# Patient Record
Sex: Male | Born: 1995 | Race: Black or African American | Hispanic: No | Marital: Single | Smoking: Never smoker
Health system: Southern US, Community
[De-identification: ages and names within clinical notes are randomized; demographics above are authoritative.]

## PROBLEM LIST (undated history)

## (undated) HISTORY — PX: KNEE SURGERY: SHX244

---

## 2020-05-28 ENCOUNTER — Emergency Department: Payer: Self-pay

## 2020-05-28 ENCOUNTER — Emergency Department
Admission: EM | Admit: 2020-05-28 | Discharge: 2020-05-28 | Disposition: A | Discharge status: Home / Self Care | Payer: Self-pay | Attending: Emergency Medicine | Admitting: Emergency Medicine

## 2020-05-28 ENCOUNTER — Other Ambulatory Visit: Payer: Self-pay

## 2020-05-28 DIAGNOSIS — R4020 Unspecified coma: Secondary | ICD-10-CM

## 2020-05-28 DIAGNOSIS — Z20822 Contact with and (suspected) exposure to covid-19: Secondary | ICD-10-CM | POA: Insufficient documentation

## 2020-05-28 DIAGNOSIS — R55 Syncope and collapse: Secondary | ICD-10-CM | POA: Insufficient documentation

## 2020-05-28 LAB — URINE DRUG SCREEN, QUALITATIVE (ARMC ONLY)
Amphetamines, Ur Screen: NOT DETECTED
Barbiturates, Ur Screen: NOT DETECTED
Benzodiazepine, Ur Scrn: NOT DETECTED
Cannabinoid 50 Ng, Ur ~~LOC~~: POSITIVE — AB
Cocaine Metabolite,Ur ~~LOC~~: NOT DETECTED
MDMA (Ecstasy)Ur Screen: NOT DETECTED
Methadone Scn, Ur: NOT DETECTED
Opiate, Ur Screen: NOT DETECTED
Phencyclidine (PCP) Ur S: NOT DETECTED
Tricyclic, Ur Screen: NOT DETECTED

## 2020-05-28 LAB — CBC WITH DIFFERENTIAL/PLATELET
Abs Immature Granulocytes: 0.01 10*3/uL (ref 0.00–0.07)
Basophils Absolute: 0 10*3/uL (ref 0.0–0.1)
Basophils Relative: 1 %
Eosinophils Absolute: 0.1 10*3/uL (ref 0.0–0.5)
Eosinophils Relative: 1 %
HCT: 39.4 % (ref 39.0–52.0)
Hemoglobin: 12.4 g/dL — ABNORMAL LOW (ref 13.0–17.0)
Immature Granulocytes: 0 %
Lymphocytes Relative: 35 %
Lymphs Abs: 1.9 10*3/uL (ref 0.7–4.0)
MCH: 24.4 pg — ABNORMAL LOW (ref 26.0–34.0)
MCHC: 31.5 g/dL (ref 30.0–36.0)
MCV: 77.4 fL — ABNORMAL LOW (ref 80.0–100.0)
Monocytes Absolute: 0.7 10*3/uL (ref 0.1–1.0)
Monocytes Relative: 13 %
Neutro Abs: 2.6 10*3/uL (ref 1.7–7.7)
Neutrophils Relative %: 50 %
Platelets: 231 10*3/uL (ref 150–400)
RBC: 5.09 MIL/uL (ref 4.22–5.81)
RDW: 14.3 % (ref 11.5–15.5)
WBC: 5.2 10*3/uL (ref 4.0–10.5)
nRBC: 0 % (ref 0.0–0.2)

## 2020-05-28 LAB — URINALYSIS, COMPLETE (UACMP) WITH MICROSCOPIC
Bacteria, UA: NONE SEEN
Bilirubin Urine: NEGATIVE
Glucose, UA: NEGATIVE mg/dL
Hgb urine dipstick: NEGATIVE
Ketones, ur: NEGATIVE mg/dL
Leukocytes,Ua: NEGATIVE
Nitrite: NEGATIVE
Protein, ur: NEGATIVE mg/dL
Specific Gravity, Urine: 1.023 (ref 1.005–1.030)
Squamous Epithelial / HPF: NONE SEEN (ref 0–5)
pH: 6 (ref 5.0–8.0)

## 2020-05-28 LAB — COMPREHENSIVE METABOLIC PANEL
ALT: 13 U/L (ref 0–44)
AST: 30 U/L (ref 15–41)
Albumin: 4.2 g/dL (ref 3.5–5.0)
Alkaline Phosphatase: 51 U/L (ref 38–126)
Anion gap: 6 (ref 5–15)
BUN: 9 mg/dL (ref 6–20)
CO2: 25 mmol/L (ref 22–32)
Calcium: 8.9 mg/dL (ref 8.9–10.3)
Chloride: 107 mmol/L (ref 98–111)
Creatinine, Ser: 1.07 mg/dL (ref 0.61–1.24)
GFR, Estimated: >60 mL/min (ref >60)
Glucose, Bld: 97 mg/dL (ref 70–99)
Potassium: 4 mmol/L (ref 3.5–5.1)
Sodium: 138 mmol/L (ref 135–145)
Total Bilirubin: 0.9 mg/dL (ref 0.3–1.2)
Total Protein: 7.7 g/dL (ref 6.5–8.1)

## 2020-05-28 LAB — ETHANOL: Alcohol, Ethyl (B): <10 mg/dL (ref <10)

## 2020-05-28 LAB — RESP PANEL BY RT-PCR (FLU A&B, COVID) ARPGX2
Influenza A by PCR: NEGATIVE
Influenza B by PCR: NEGATIVE
SARS Coronavirus 2 by RT PCR: NEGATIVE

## 2020-05-28 LAB — LACTIC ACID, PLASMA: Lactic Acid, Venous: 1.1 mmol/L (ref 0.5–1.9)

## 2020-05-28 MED ORDER — IBUPROFEN 600 MG PO TABS
600.0000 mg | ORAL_TABLET | Freq: Once | ORAL | Status: AC
  Administered 2020-05-28: 600 mg via ORAL
  Filled 2020-05-28: qty 1

## 2020-05-28 NOTE — Discharge Instructions (Addendum)
It is unclear at this point if you had a seizure or not. Therefore it is very important they follow-up with a primary care doctor and a neurologist for further evaluation.  In the meantime please follow the below recommendations.  Seizures may happen at any time. It is important to take certain precautions to maintain your safety.   Follow up with your doctor in 1-3 days.  If you were started on a seizure medication, take it as prescribed.  During a seizure, a person may injure himself or herself. Seizure precautions are guidelines that a person can follow in order to minimize injury during a seizure. For any activity, it is important to ask, "What would happen if I had a seizure while doing this?" Follow the below precautions.  Bathroom Safety  A person with seizures may want to shower instead of bathe to avoid accidental drowning. If falls occur during the patient's typical seizure, a person should use a shower seat, preferably one with a safety strap.  Use nonskid strips in your shower or tub.  Never use electrical equipment near water. This prevents accidental electrocution.  Consider changing glass in shower doors to shatterproof glass.  Secondary school teacher If possible, cook when someone else is nearby.  Use the back burners of the stove to prevent accidental burns.  Use shatterproof containers as much as possible. For instance, sauces can be transferred from glass bottles to plastic containers for use.  Limit time that is required using knives or other sharp objects. If possible, buy foods that are already cut, or ask someone to help in meal preparation.   General Safety at Home Do not smoke or light fires in the fireplace unless someone else is present.  Do not use space heaters that can be accidentally overturned.  When alone, avoid using step stools or ladders, and do not clean rooftop gutters.  Purchase power tools and motorized Risk manager which have a safety switch that will stop  the machine if you release the handle (a 'dead man's' switch).   Driving and Transportation DO NOT DRIVE UNTIL YOU ARE CLEARED BY A NEUROLOGIST and/or you have permission to drive from your state's Department of Motor Vehicles  [facility]). Each state has different laws. Please refer to the following link on the [employer]'s website for more information: http://www.epilepsyfoundation.org/answerplace/Social/driving/drivingu.cfm  If you ride a bicycle, wear a helmet and any other necessary protective gear.  When taking public transportation like the bus or subway, stay clear of the platform edge.   Outdoor Theatre manager is okay, but does present certain risks. Never swim alone, and tell friends what to do if you have a seizure while swimming.  Wear appropriate protective equipment.  Ski with a friend. If a seizure occurs, your friend can seek help, if needed. He or she can also help to get you out of the cold. Consider using a safety hook or belt while riding the ski lift.

## 2020-05-28 NOTE — ED Provider Notes (Signed)
[facility] Emergency Department Provider Note  ____________________________________________  Time seen: Approximately 4:38 AM  I have reviewed the triage vital signs and the nursing notes.   HISTORY  Chief Complaint Seizures   HPI Brian Summers is a 24 y.o. male no significant past medical history who presents for evaluation of loss of consciousness.  Patient is brought in by his boyfriend who provides most of the history.  According to him yesterday patient had an episode where he lost consciousness, became stiff and what he describes as generalized shaking.  That lasted 5 minutes. No post-ictal phase, no urinary or bowel loss. Patient asked boyfriend not to call 911. This evening, they were with some friends wrapping gifts when another episode happened. Boyfriend describes as patient loosing consciousness, becoming stiff, and then shaking all extremities.  He reports that the entire episode lasted 15 minutes.  He reports that he had a brief pause of shaking in the middle of it but started again.  Again no postictal phase, patient has no recollection of these events.  He has no warning symptoms.  He has no history of seizures.  His father has a history of epilepsy.  No history of febrile seizures as an infant.  No urinary or bowel loss, no tongue trauma.  Patient did not injury himself from these episodes.  Patient reports occasional edibles and alcohol but none yesterday or today.  Denies any other drug use.  He is complaining of generalized muscle aches. Patient reports 4 days of decreased appetite, has not been eating or drinking well, and had 1 or 2 episodes of NBNB emesis. No diarrhea, no fever, no cough.  No known exposures to Covid or flu.  No family history of sudden death  PMH None - reviewed  Allergies Patient has no known allergies.  FH Father - epilepsy  Social History Smoking - never Alcohol - occasionally Drugs - edible  Review of  Systems  Constitutional: Negative for fever. + LOC Eyes: Negative for visual changes. ENT: Negative for sore throat. Neck: No neck pain  Cardiovascular: Negative for chest pain. Respiratory: Negative for shortness of breath. Gastrointestinal: Negative for abdominal pain, diarrhea. + vomiting Genitourinary: Negative for dysuria. Musculoskeletal: Negative for back pain. Skin: Negative for rash. Neurological: Negative for headaches, weakness or numbness. Psych: No SI or HI  ____________________________________________   PHYSICAL EXAM:  VITAL SIGNS: ED Triage Vitals  Enc Vitals Group     BP 05/28/20 0234 135/85     Pulse Rate 05/28/20 0234 65     Resp 05/28/20 0234 15     Temp 05/28/20 0234 98.1 F (36.7 C)     Temp Source 05/28/20 0234 Oral     SpO2 05/28/20 0234 100 %     Weight 05/28/20 0230 132 lb (59.9 kg)     Height 05/28/20 0230 5\' 10"  (1.778 m)     Head Circumference --      Peak Flow --      Pain Score 05/28/20 0230 8     Pain Loc --      Pain Edu? --      Excl. in GC? --     Constitutional: Alert and oriented. Well appearing and in no apparent distress. HEENT:      Head: Normocephalic and atraumatic.         Eyes: Conjunctivae are normal. Sclera is non-icteric.       Mouth/Throat: Mucous membranes are moist.       Neck: Supple  with no signs of meningismus. No c spine tenderness Cardiovascular: Regular rate and rhythm. No murmurs, gallops, or rubs. 2+ symmetrical distal pulses are present in all extremities. No JVD. Respiratory: Normal respiratory effort. Lungs are clear to auscultation bilaterally. No wheezes, crackles, or rhonchi.  Gastrointestinal: Soft, non tender, and non distended with positive bowel sounds. No rebound or guarding. Musculoskeletal: Nontender with normal range of motion in all extremities.  No T and L-spine tenderness.  No edema, cyanosis, or erythema of extremities. Neurologic: Normal speech and language. Face is symmetric. EOMI, PERRl, no  nystagmus, Moving all extremities. No gross focal neurologic deficits are appreciated. Skin: Skin is warm, dry and intact. No rash noted. Psychiatric: Mood and affect are normal. Speech and behavior are normal.  ____________________________________________   LABS (all labs ordered are listed, but only abnormal results are displayed)  Labs Reviewed  CBC WITH DIFFERENTIAL/PLATELET - Abnormal; Notable for the following components:      Result Value   Hemoglobin 12.4 (*)    MCV 77.4 (*)    MCH 24.4 (*)    All other components within normal limits  RESP PANEL BY RT-PCR (FLU A&B, COVID) ARPGX2  COMPREHENSIVE METABOLIC PANEL  LACTIC ACID, PLASMA  ETHANOL  URINE DRUG SCREEN, QUALITATIVE ([facility] ONLY)  URINALYSIS, COMPLETE (UACMP) WITH MICROSCOPIC   ____________________________________________  EKG  ED ECG REPORT I, Nita Sickle, the attending physician, personally viewed and interpreted this ECG.  Normal sinus rhythm, normal intervals, normal axis, no STE or depressions, no evidence of HOCM, AV block, delta wave, ARVD, prolonged QTc, WPW, or Brugada.   ____________________________________________  RADIOLOGY  I have personally reviewed the images performed during this visit and I agree with the Radiologist's read.   Interpretation by Radiologist:  CT Head Wo Contrast  Result Date: 05/28/2020 CLINICAL DATA:  Seizure EXAM: CT HEAD WITHOUT CONTRAST TECHNIQUE: Contiguous axial images were obtained from the base of the skull through the vertex without intravenous contrast. COMPARISON:  None. FINDINGS: Brain: No evidence of acute territorial infarction, hemorrhage, hydrocephalus,extra-axial collection or mass lesion/mass effect. Normal gray-white differentiation. Ventricles are normal in size and contour. Vascular: No hyperdense vessel or unexpected calcification. Skull: The skull is intact. No fracture or focal lesion identified. Sinuses/Orbits: The visualized paranasal sinuses and  mastoid air cells are clear. The orbits and globes intact. Other: None IMPRESSION: No acute intracranial abnormality. Electronically Signed   By: Jonna Clark M.D.   On: 05/28/2020 03:58    ____________________________________________   PROCEDURES  Procedure(s) performed:yes .1-3 Lead EKG Interpretation Performed by: Nita Sickle, MD Authorized by: Nita Sickle, MD     Interpretation: non-specific     ECG rate assessment: normal     Rhythm: sinus rhythm     Ectopy: none     Critical Care performed:  None ____________________________________________   INITIAL IMPRESSION / ASSESSMENT AND PLAN / ED COURSE  24 y.o. male no significant past medical history who presents for evaluation of loss of consciousness.  Episodes described as loss of consciousness with stiffening and generalized shaking that lasted 15 minutes with no postictal phase, no urinary or bowel loss, no tongue trauma.  Patient is complete neurologically intact.  His father does have a history of epilepsy.  Seems to be the second episode in the last 2 days.  No prior episodes.  Patient is extremely well-appearing in no distress, neurologically intact.  No signs of trauma on exam.  EKG with no signs of dysrhythmias.  Patient placed on telemetry for monitor for any  signs of dysrhythmias.  Labs show no leukocytosis, no significant electrolyte derangements, negative alcohol level.  Head CT with no mass or abnormalities, visualized by me confirmed by radiology.  Lactic acidosis is 1.1 which makes it less likely that this was a seizure.  I would expect that a 15-minute seizure would cause some lactic acidosis on the patient from muscle ischemia.  Will monitor closely for any signs of dysrhythmias.  Patient will need further evaluation as an outpatient with Holter monitoring and EEG. Denies any drugs in the last 2 days. Has edible occasionally. Anorexia and vomiting for the last few days. Possibly viral. Will check for covid  and flu.  History gathered mostly from patient's boyfriend who witnessed both episodes but also from patient himself.  Plan discussed with both of them.  _________________________ 6:21 AM on 05/28/2020 -----------------------------------------  Patient continued to be monitored with further episodes of LOC.  Will refer to primary care and neurology for further evaluation.  Discussed with him the importance of Holter monitoring and EEG to rule out cardiac dysrhythmias and seizure.  In the meantime discussed pretty strict seizure precautions.  Discussed my standard return precautions for     _____________________________________________ Please note:  Patient was evaluated in Emergency Department today for the symptoms described in the history of present illness. Patient was evaluated in the context of the global COVID-19 pandemic, which necessitated consideration that the patient might be at risk for infection with the SARS-CoV-2 virus that causes COVID-19. Institutional protocols and algorithms that pertain to the evaluation of patients at risk for COVID-19 are in a state of rapid change based on information released by regulatory bodies including the CDC and federal and state organizations. These policies and algorithms were followed during the patient's care in the ED.  Some ED evaluations and interventions may be delayed as a result of limited staffing during the pandemic.   [place] Controlled Substance Database was reviewed by me. ____________________________________________   FINAL CLINICAL IMPRESSION(S) / ED DIAGNOSES   Final diagnoses:  Loss of consciousness (HCC)      NEW MEDICATIONS STARTED DURING THIS VISIT:  ED Discharge Orders    None       Note:  This document was prepared using Dragon voice recognition software and may include unintentional dictation errors.    Don Perking, [place], MD 05/28/20 [phone]

## 2020-05-28 NOTE — ED Triage Notes (Signed)
Pt BIBA from home for possible seizure. EMS reports seizure had ended by the time they arrived. EMS reports speaking with pts mother who stated pt had had seizures in past and had stopped taking meds. However, pt denies any hx of seizures and denies ever being on anti seizure meds. Pt reports hx of syncopal episodes. Pt A&O x 4 on arrival. Pt has c/o generalized mild pain at this time.

## 2020-08-18 ENCOUNTER — Other Ambulatory Visit: Payer: Self-pay

## 2020-08-26 ENCOUNTER — Other Ambulatory Visit: Payer: Self-pay

## 2020-10-03 ENCOUNTER — Other Ambulatory Visit: Payer: Self-pay

## 2020-10-03 ENCOUNTER — Inpatient Hospital Stay
Admission: EM | Admit: 2020-10-03 | Discharge: 2020-10-10 | DRG: 095 | Disposition: A | Discharge status: Home Health | Payer: Self-pay | Attending: Internal Medicine | Admitting: Internal Medicine

## 2020-10-03 DIAGNOSIS — Z820 Family history of epilepsy and other diseases of the nervous system: Secondary | ICD-10-CM

## 2020-10-03 DIAGNOSIS — R531 Weakness: Secondary | ICD-10-CM

## 2020-10-03 DIAGNOSIS — Z20822 Contact with and (suspected) exposure to covid-19: Secondary | ICD-10-CM | POA: Diagnosis present

## 2020-10-03 DIAGNOSIS — B259 Cytomegaloviral disease, unspecified: Secondary | ICD-10-CM | POA: Diagnosis present

## 2020-10-03 DIAGNOSIS — E739 Lactose intolerance, unspecified: Secondary | ICD-10-CM | POA: Diagnosis present

## 2020-10-03 DIAGNOSIS — Z8249 Family history of ischemic heart disease and other diseases of the circulatory system: Secondary | ICD-10-CM

## 2020-10-03 DIAGNOSIS — R7401 Elevation of levels of liver transaminase levels: Secondary | ICD-10-CM | POA: Diagnosis present

## 2020-10-03 DIAGNOSIS — R000 Tachycardia, unspecified: Secondary | ICD-10-CM | POA: Diagnosis not present

## 2020-10-03 DIAGNOSIS — R200 Anesthesia of skin: Secondary | ICD-10-CM

## 2020-10-03 DIAGNOSIS — G9389 Other specified disorders of brain: Secondary | ICD-10-CM | POA: Diagnosis present

## 2020-10-03 DIAGNOSIS — H5712 Ocular pain, left eye: Secondary | ICD-10-CM | POA: Diagnosis present

## 2020-10-03 DIAGNOSIS — E871 Hypo-osmolality and hyponatremia: Secondary | ICD-10-CM | POA: Diagnosis not present

## 2020-10-03 DIAGNOSIS — R2681 Unsteadiness on feet: Secondary | ICD-10-CM | POA: Diagnosis present

## 2020-10-03 DIAGNOSIS — R110 Nausea: Secondary | ICD-10-CM | POA: Diagnosis not present

## 2020-10-03 DIAGNOSIS — Z9181 History of falling: Secondary | ICD-10-CM

## 2020-10-03 DIAGNOSIS — G610 Guillain-Barre syndrome: Principal | ICD-10-CM | POA: Diagnosis present

## 2020-10-03 DIAGNOSIS — E538 Deficiency of other specified B group vitamins: Secondary | ICD-10-CM | POA: Diagnosis present

## 2020-10-03 DIAGNOSIS — R197 Diarrhea, unspecified: Secondary | ICD-10-CM | POA: Diagnosis not present

## 2020-10-03 DIAGNOSIS — D649 Anemia, unspecified: Secondary | ICD-10-CM | POA: Diagnosis present

## 2020-10-03 DIAGNOSIS — E559 Vitamin D deficiency, unspecified: Secondary | ICD-10-CM | POA: Diagnosis present

## 2020-10-03 NOTE — ED Provider Notes (Signed)
[facility] Emergency Department Provider Note  ____________________________________________   Event Date/Time   First MD Initiated Contact with Patient 10/03/20 2343     (approximate)  I have reviewed the triage vital signs and the nursing notes.   HISTORY  Chief Complaint Leg Pain    HPI Brian Summers is a 25 y.o. male with no significant past medical history who presents to the emergency department with multiple complaints.  He states that symptoms started about a week ago when he began having sharp, severe pain in his right lower extremity.  States he then started feeling numbness in the right thigh, shin and foot.  States then pain moved to the left leg and he feels that the left shin and left foot are now numb.  He states he is also now started having tingling in all of his fingertips on both sides.  He is having pain going down the left arm as well.  States he then started having numbness to the left face and has had 2 episodes where he was unable to open the left eye and was concerned that he may be having a stroke.  He is complaining of neck pain and lower back pain.  He denies any injury to his neck or back.  No headache.  No fevers, cough, vomiting, diarrhea.  No chest pain or shortness of breath.  He has never had similar symptoms.  No family history of multiple sclerosis.  He denies any weight loss, night sweats.  Denies any drug or alcohol use.        History reviewed. No pertinent past medical history.  Patient Active Problem List   Diagnosis Date Noted  . Weakness 10/04/2020    Past Surgical History:  Procedure Laterality Date  . KNEE SURGERY Bilateral     Prior to Admission medications   Not on File    Allergies Patient has no known allergies.  History reviewed. No pertinent family history.  Social History Social History   Tobacco Use  . Smoking status: Never Smoker  . Smokeless tobacco: Never Used  Substance Use Topics  .  Alcohol use: Never  . Drug use: Never    Review of Systems Constitutional: No fever. Eyes: No visual changes. ENT: No sore throat. Cardiovascular: Denies chest pain. Respiratory: Denies shortness of breath. Gastrointestinal: No nausea, vomiting, diarrhea. Genitourinary: Negative for dysuria. Musculoskeletal: + for back pain. Skin: Negative for rash. Neurological: + for focal weakness or numbness.  ____________________________________________   PHYSICAL EXAM:  VITAL SIGNS: ED Triage Vitals  Enc Vitals Group     BP 10/03/20 2340 (!) 152/92     Pulse Rate 10/03/20 2340 66     Resp 10/03/20 2340 15     Temp 10/03/20 2340 98 F (36.7 C)     Temp Source 10/03/20 2340 Oral     SpO2 10/03/20 2340 100 %     Weight 10/03/20 2341 130 lb (59 kg)     Height 10/03/20 2341  (1.778 m)     Head Circumference --      Peak Flow --      Pain Score 10/03/20 2340 10     Pain Loc --      Pain Edu? --      Excl. in GC? --    CONSTITUTIONAL: Alert and oriented and responds appropriately to questions. Well-appearing; well-nourished HEAD: Normocephalic, atraumatic EYES: Conjunctivae clear, pupils appear equal, EOM appear intact ENT: normal nose; moist mucous membranes NECK:  Supple, normal ROM, no midline spinal tenderness or step-off or deformity; tender to palpation over the left trapezius muscle without redness, warmth, soft tissue swelling or ecchymosis; no meningismus; 1 slightly enlarged posterior cervical lymph node without associated redness, warmth CARD: RRR; S1 and S2 appreciated; no murmurs, no clicks, no rubs, no gallops RESP: Normal chest excursion without splinting or tachypnea; breath sounds clear and equal bilaterally; no wheezes, no rhonchi, no rales, no hypoxia or respiratory distress, speaking full sentences ABD/GI: Normal bowel sounds; non-distended; soft, non-tender, no rebound, no guarding, no peritoneal signs, no hepatosplenomegaly BACK: The back appears normal, no  midline spinal tenderness or step-off or deformity EXT: Normal ROM in all joints; no deformity noted, no edema; no cyanosis, reports his extremities are painful to touch, compartments are soft, no joint effusion, no rash or redness/warmth, 2+ radial and DP pulses bilaterally SKIN: Normal color for age and race; warm; no rash on exposed skin NEURO: Moves all extremities equally, diminished strength in bilateral lower extremities versus poor effort.  Normal strength in bilateral upper extremities.  Normal reflexes.  Reports decreased sensation in his right thigh, shin, calf and foot.  Reports decreased sensation in the left shin, calf and foot.  Normal sensation in his face and upper extremities.  Cranial nerves II through XII intact.  Normal speech. PSYCH: The patient's mood and manner are appropriate.  ____________________________________________   LABS (all labs ordered are listed, but only abnormal results are displayed)  Labs Reviewed  CBC WITH DIFFERENTIAL/PLATELET - Abnormal; Notable for the following components:      Result Value   Hemoglobin 12.5 (*)    MCV 75.6 (*)    MCH 23.6 (*)    Lymphs Abs 5.8 (*)    All other components within normal limits  COMPREHENSIVE METABOLIC PANEL - Abnormal; Notable for the following components:   Calcium 8.8 (*)    AST 76 (*)    ALT 70 (*)    All other components within normal limits  CK - Abnormal; Notable for the following components:   Total CK 486 (*)    All other components within normal limits  URINALYSIS, ROUTINE W REFLEX MICROSCOPIC - Abnormal; Notable for the following components:   Color, Urine YELLOW (*)    APPearance CLEAR (*)    All other components within normal limits  C-REACTIVE PROTEIN - Abnormal; Notable for the following components:   CRP 1.0 (*)    All other components within normal limits  CSF CELL COUNT WITH DIFFERENTIAL - Abnormal; Notable for the following components:   Appearance, CSF CLEAR (*)    All other  components within normal limits  CSF CELL COUNT WITH DIFFERENTIAL - Abnormal; Notable for the following components:   Appearance, CSF CLEAR (*)    All other components within normal limits  CBG MONITORING, ED - Abnormal; Notable for the following components:   Glucose-Capillary 130 (*)    All other components within normal limits  RESP PANEL BY RT-PCR (FLU A&B, COVID) ARPGX2  CSF CULTURE W GRAM STAIN  MAGNESIUM  TSH  VITAMIN B12  FOLATE  SEDIMENTATION RATE  PROTEIN AND GLUCOSE, CSF  HIV ANTIBODY (ROUTINE TESTING W REFLEX)  VITAMIN D 25 HYDROXY (VIT D DEFICIENCY, FRACTURES)  MS PROFILE+MBP, CSF + BLOOD  DRAW EXTRA CLOT TUBE  HSV 1/2 PCR, CSF  VDRL, CSF  EPSTEIN-BARR VIRUS VCA, IGG  EPSTEIN-BARR VIRUS VCA, IGM  TROPONIN I (HIGH SENSITIVITY)   ____________________________________________  EKG   ____________________________________________  RADIOLOGY Normajean Baxter  Dalayah Deahl, personally viewed and evaluated these images (plain radiographs) as part of my medical decision making, as well as reviewing the written report by the radiologist.  ED MD interpretation: MRI showed no acute abnormality.  Official radiology report(s): MR Brain W and Wo Contrast  Result Date: 10/04/2020 CLINICAL DATA:  25 year old male with 1 week of bilateral leg weakness and numbness, symptom progression to upper extremities. EXAM: MRI HEAD WITHOUT AND WITH CONTRAST TECHNIQUE: Multiplanar, multiecho pulse sequences of the brain and surrounding structures were obtained without and with intravenous contrast. CONTRAST:  7.5mL GADAVIST GADOBUTROL 1 MMOL/ML IV SOLN COMPARISON:  Head CT 05/28/2020. FINDINGS: Mild susceptibility artifact along the anterior vertex of unclear etiology. Brain: Focal cortical encephalomalacia (series 17, image 109) or less likely congenital sulcal variant along the anterior left superior frontal gyrus. T2 appearance also favors chronic encephalomalacia (series 18, image 23), and this is stable  from the 06/18/23 CT. No superimposed restricted diffusion to suggest acute infarction. No midline shift, mass effect, evidence of mass lesion, ventriculomegaly, extra-axial collection or acute intracranial hemorrhage. Cervicomedullary junction and pituitary are within normal limits. Wallace Cullens and white matter signal appears normal outside of the anterior left superior frontal gyrus. No chronic cerebral blood products are evident. No cerebral white matter lesions. Deep gray matter nuclei, brainstem and cerebellum appear normal. No abnormal enhancement identified. No dural thickening. There is a small developmental venous anomaly in proximity to the abnormal anterior left frontal lobe (series 19, image 105), normal variant. Vascular: Major intracranial vascular flow voids are preserved. The major dural venous sinuses are enhancing and appear to be patent. Skull and upper cervical spine: Normal visible cervical spine. Visualized bone marrow signal is within normal limits. Sinuses/Orbits: Hyperplastic paranasal sinuses. Heterogeneous T1/FLAIR/T2 material now in the lateral portion of the left frontal sinus compatible with inflammatory sinus disease. Mild associated mucosal thickening at the left frontoethmoidal recess. Mild mucosal thickening also in the alveolar recess of the right maxillary sinus now. The other sinuses remain clear. Orbits appear negative. Other: Mastoids are clear. Visible internal auditory structures appear normal. IMPRESSION: 1. No acute intracranial abnormality. 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 Given the location perhaps this is sequelae of remote trauma. Negative MRI appearance of the brain otherwise. No evidence of demyelination. 2. Mild to moderate new paranasal sinus inflammation since June 18, 2023, most pronounced in the left frontal sinus. Electronically Signed   By: Odessa Fleming M.D.   On: 10/04/2020  06:07   MR Cervical Spine W or Wo Contrast  Result Date: 10/04/2020 CLINICAL DATA:  25 year old male with 1 week of bilateral leg weakness and numbness, symptom progression to upper extremities. EXAM: MRI CERVICAL SPINE WITHOUT AND WITH CONTRAST TECHNIQUE: Multiplanar and multiecho pulse sequences of the cervical spine, to include the craniocervical junction and cervicothoracic junction, were obtained without and with intravenous contrast. CONTRAST:  7.21mL GADAVIST GADOBUTROL 1 MMOL/ML IV SOLN in conjunction with contrast enhanced imaging of the brain reported separately. COMPARISON:  Brain MRI the same day reported separately. FINDINGS: Alignment: Straightening of cervical lordosis. Subtle dextroconvex cervical scoliosis. No spondylolisthesis. Vertebrae: No marrow edema or evidence of acute osseous abnormality. Visualized bone marrow signal is within normal limits. Cord: Normal. No spinal cord signal abnormality or volume loss identified. No cord expansion. No abnormal intradural enhancement. No dural thickening. Posterior Fossa, vertebral arteries, paraspinal tissues: Cervicomedullary junction is within normal limits. Brain reported separately today. Preserved major vascular flow voids  in the neck. The left vertebral artery appears somewhat dominant. Negative visible neck soft tissues, lung apices. Disc levels: Capacious spinal canal. No significant cervical spine degeneration. No spinal stenosis or convincing neural impingement. IMPRESSION: Negative MRI appearance of the cervical spine aside from mild dextroconvex scoliosis. Normal cervical spinal cord.  No significant degenerative changes. Electronically Signed   By: Odessa Fleming M.D.   On: 10/04/2020 06:11   MR THORACIC SPINE W WO CONTRAST  Result Date: 10/04/2020 CLINICAL DATA:  25 year old male with 1 week of bilateral leg weakness and numbness, symptom progression to upper extremities. EXAM: MRI THORACIC WITHOUT AND WITH CONTRAST TECHNIQUE: Multiplanar  and multiecho pulse sequences of the thoracic spine were obtained without and with intravenous contrast. CONTRAST:  7.17mL GADAVIST GADOBUTROL 1 MMOL/ML IV SOLN in conjunction with contrast enhanced imaging of the brain and cervical spine reported separately. COMPARISON:  Brain and cervical spine today reported separately. FINDINGS: Limited cervical spine imaging:  Detailed separately today. Thoracic spine segmentation:  Appears normal. Alignment: Normal thoracic kyphosis. Minimal levoconvex upper thoracic scoliosis. No spondylolisthesis. Vertebrae: No marrow edema or evidence of acute osseous abnormality. Visualized bone marrow signal is within normal limits. Cord: Normal thoracic spinal cord. No cord signal abnormality or volume loss identified. No abnormal intradural enhancement or dural thickening. Capacious thoracic spinal canal throughout. The conus medullaris appears normal at T12-L1. Paraspinal and other soft tissues: Negative. Disc levels: Negative. No thoracic spine degeneration or neural impingement identified. IMPRESSION: Normal Thoracic Spine MRI. Electronically Signed   By: Odessa Fleming M.D.   On: 10/04/2020 06:16    ____________________________________________   PROCEDURES  Procedure(s) performed (including Critical Care):  .Lumbar Puncture  Date/Time: 10/04/2020 7:34 AM Performed by: Maitland Lesiak, Layla Maw, DO Authorized by: Jona Zappone, Layla Maw, DO   Consent:    Consent obtained:  Written   Consent given by:  Patient   Risks, benefits, and alternatives were discussed: yes     Risks discussed:  Bleeding, headache, nerve damage, infection, pain and repeat procedure   Alternatives discussed:  Delayed treatment, no treatment, alternative treatment and observation Universal protocol:    Procedure explained and questions answered to patient or proxy's satisfaction: yes     Relevant documents present and verified: yes     Test results available: yes     Imaging studies available: yes     Required  blood products, implants, devices, and special equipment available: yes     Immediately prior to procedure a time out was called: yes     Site/side marked: yes     Patient identity confirmed:  Verbally with patient Pre-procedure details:    Procedure purpose:  Diagnostic   Preparation: Patient was prepped and draped in usual sterile fashion   Anesthesia:    Anesthesia method:  Local infiltration   Local anesthetic:  Lidocaine 1% w/o epi Procedure details:    Lumbar space:  L3-L4 interspace   Patient position:  Sitting   Needle gauge:  20   Needle type:  Spinal needle - Quincke tip   Needle length (in):  3.5   Ultrasound guidance: no     Number of attempts:  1   Opening pressure (cm H2O): unable to obtain as patient unable to tolerate procedure lying on side.   Fluid appearance:  Clear   Tubes of fluid:  4   Total volume (ml):  6 Post-procedure details:    Puncture site:  Adhesive bandage applied and direct pressure applied   Procedure completion:  Tolerated  well, no immediate complications    CRITICAL CARE Performed by: Rochele Raring   Total critical care time: 65 minutes  Critical care time was exclusive of separately billable procedures and treating other patients.  Critical care was necessary to treat or prevent imminent or life-threatening deterioration.  Critical care was time spent personally by me on the following activities: development of treatment plan with patient and/or surrogate as well as nursing, discussions with consultants, evaluation of patient's response to treatment, examination of patient, obtaining history from patient or surrogate, ordering and performing treatments and interventions, ordering and review of laboratory studies, ordering and review of radiographic studies, pulse oximetry and re-evaluation of patient's condition.  ____________________________________________   INITIAL IMPRESSION / ASSESSMENT AND PLAN / ED COURSE  As part of my medical  decision making, I reviewed the following data within the electronic MEDICAL RECORD NUMBER Nursing notes reviewed and incorporated, Labs reviewed , Old chart reviewed, Discussed with admitting physician , A consult was requested and obtained from this/these consultant(s) Neurology and Notes from prior ED visits         Patient has multiple complaints including multiple different neurologic symptoms.  Given waxing and waning neurologic symptoms involving face, arms and legs, I am concerned for possible multiple sclerosis.  I have consulted telemetry neurology, Dr. Laurene Footman, who has evaluated the patient and they agree and recommend MRI with and without contrast of the brain, cervical spine and thoracic spine today.  They would like this to be performed prior to patient receiving a lumbar puncture.  Have recommended multiple labs.  I have low suspicion for intracranial hemorrhage, CVA, CVT, meningitis.  He is complaining of pain mostly in his extremities.  Will give Toradol and reassess.  ED PROGRESS  Patient reports no improvement with Toradol.  Will give morphine.  Labs thus far have been unremarkable.  Normal white blood cell count.  Normal electrolytes.  Minimally elevated AST and ALT.  CK also mildly elevated.  He is receiving IV fluids and has a normal creatinine.  Urine shows no sign of infection or myoglobinuria.   Patient still having pain after morphine.  Will give Dilaudid.  6:30 AM  Pt's MRI showed no acute abnormality.  Lumbar puncture performed without difficulty however patient was not able to tolerate procedure lying flat so this was then sitting up and therefore were not able to obtain a pressure.  Discussed case again with telemetry neurologist Dr. Laurene Footman who is concerned that his B12 level is low and could be contributing to symptoms.  Discussed with pharmacist who recommends giving 1000 mcg of IM B12 now.  He denies using any nitrous oxide or dietary restrictions.  Will admit patient to the  hospitalist service.  CSF pending.  7:38 AM Discussed patient's case with hospitalist, Dr. Joylene Igo.  I have recommended admission and patient (and family if present) agree with this plan. Admitting physician will place admission orders.   I reviewed all nursing notes, vitals, pertinent previous records and reviewed/interpreted all EKGs, lab and urine results, imaging (as available).  CSF Gram stain negative.  Glucose, protein normal. ____________________________________________   FINAL CLINICAL IMPRESSION(S) / ED DIAGNOSES  Final diagnoses:  Numbness  Weakness  B12 deficiency     ED Discharge Orders    None      *Please note:  Dekari Bures was evaluated in Emergency Department on 10/04/2020 for the symptoms described in the history of present illness. He was evaluated in the context of the global COVID-19 pandemic, which  necessitated consideration that the patient might be at risk for infection with the SARS-CoV-2 virus that causes COVID-19. Institutional protocols and algorithms that pertain to the evaluation of patients at risk for COVID-19 are in a state of rapid change based on information released by regulatory bodies including the CDC and federal and state organizations. These policies and algorithms were followed during the patient's care in the ED.  Some ED evaluations and interventions may be delayed as a result of limited staffing during and the pandemic.*   Note:  This document was prepared using Dragon voice recognition software and may include unintentional dictation errors.   Miah Boye, Layla MawKristen N, DO 10/04/20 [phone]

## 2020-10-03 NOTE — ED Triage Notes (Signed)
Pt presents to ER c/o BIL leg pain and weakness x1 week.  Pt also states he has been having some tingling in his fingers and toes, and body aches.  Pt ambulatory to triage. No swelling noted at this time.

## 2020-10-04 ENCOUNTER — Emergency Department: Payer: Self-pay

## 2020-10-04 DIAGNOSIS — R531 Weakness: Secondary | ICD-10-CM

## 2020-10-04 DIAGNOSIS — R7401 Elevation of levels of liver transaminase levels: Secondary | ICD-10-CM | POA: Diagnosis present

## 2020-10-04 LAB — CBC WITH DIFFERENTIAL/PLATELET
Abs Immature Granulocytes: 0.02 10*3/uL (ref 0.00–0.07)
Basophils Absolute: 0.1 10*3/uL (ref 0.0–0.1)
Basophils Relative: 1 %
Eosinophils Absolute: 0.1 10*3/uL (ref 0.0–0.5)
Eosinophils Relative: 1 %
HCT: 40 % (ref 39.0–52.0)
Hemoglobin: 12.5 g/dL — ABNORMAL LOW (ref 13.0–17.0)
Immature Granulocytes: 0 %
Lymphocytes Relative: 67 %
Lymphs Abs: 5.8 10*3/uL — ABNORMAL HIGH (ref 0.7–4.0)
MCH: 23.6 pg — ABNORMAL LOW (ref 26.0–34.0)
MCHC: 31.3 g/dL (ref 30.0–36.0)
MCV: 75.6 fL — ABNORMAL LOW (ref 80.0–100.0)
Monocytes Absolute: 0.9 10*3/uL (ref 0.1–1.0)
Monocytes Relative: 11 %
Neutro Abs: 1.7 10*3/uL (ref 1.7–7.7)
Neutrophils Relative %: 20 %
Platelets: 246 10*3/uL (ref 150–400)
RBC: 5.29 MIL/uL (ref 4.22–5.81)
RDW: 14.2 % (ref 11.5–15.5)
Smear Review: NORMAL
WBC: 8.6 10*3/uL (ref 4.0–10.5)
nRBC: 0 % (ref 0.0–0.2)

## 2020-10-04 LAB — URINALYSIS, ROUTINE W REFLEX MICROSCOPIC
Bilirubin Urine: NEGATIVE
Glucose, UA: NEGATIVE mg/dL
Hgb urine dipstick: NEGATIVE
Ketones, ur: NEGATIVE mg/dL
Leukocytes,Ua: NEGATIVE
Nitrite: NEGATIVE
Protein, ur: NEGATIVE mg/dL
Specific Gravity, Urine: 1.015 (ref 1.005–1.030)
pH: 6 (ref 5.0–8.0)

## 2020-10-04 LAB — CSF CELL COUNT WITH DIFFERENTIAL
Eosinophils, CSF: 0 %
Eosinophils, CSF: 0 %
Lymphs, CSF: 85 %
Lymphs, CSF: 91 %
Monocyte-Macrophage-Spinal Fluid: 15 %
Monocyte-Macrophage-Spinal Fluid: 6 %
RBC Count, CSF: 0 /mm3 (ref 0–3)
RBC Count, CSF: 3 /mm3 (ref 0–3)
Segmented Neutrophils-CSF: 0 %
Segmented Neutrophils-CSF: 3 %
Tube #: 1
Tube #: 4
WBC, CSF: 11 /mm3 (ref 0–5)
WBC, CSF: 19 /mm3 (ref 0–5)

## 2020-10-04 LAB — TROPONIN I (HIGH SENSITIVITY): Troponin I (High Sensitivity): 6 ng/L (ref <18)

## 2020-10-04 LAB — COMPREHENSIVE METABOLIC PANEL
ALT: 70 U/L — ABNORMAL HIGH (ref 0–44)
AST: 76 U/L — ABNORMAL HIGH (ref 15–41)
Albumin: 3.9 g/dL (ref 3.5–5.0)
Alkaline Phosphatase: 54 U/L (ref 38–126)
Anion gap: 5 (ref 5–15)
BUN: 8 mg/dL (ref 6–20)
CO2: 26 mmol/L (ref 22–32)
Calcium: 8.8 mg/dL — ABNORMAL LOW (ref 8.9–10.3)
Chloride: 107 mmol/L (ref 98–111)
Creatinine, Ser: 0.81 mg/dL (ref 0.61–1.24)
GFR, Estimated: >60 mL/min (ref >60)
Glucose, Bld: 99 mg/dL (ref 70–99)
Potassium: 3.5 mmol/L (ref 3.5–5.1)
Sodium: 138 mmol/L (ref 135–145)
Total Bilirubin: 0.7 mg/dL (ref 0.3–1.2)
Total Protein: 7.5 g/dL (ref 6.5–8.1)

## 2020-10-04 LAB — CK: Total CK: 486 U/L — ABNORMAL HIGH (ref 49–397)

## 2020-10-04 LAB — VITAMIN D 25 HYDROXY (VIT D DEFICIENCY, FRACTURES): Vit D, 25-Hydroxy: 15.1 ng/mL — ABNORMAL LOW (ref 30–100)

## 2020-10-04 LAB — MAGNESIUM: Magnesium: 1.9 mg/dL (ref 1.7–2.4)

## 2020-10-04 LAB — RESP PANEL BY RT-PCR (FLU A&B, COVID) ARPGX2
Influenza A by PCR: NEGATIVE
Influenza B by PCR: NEGATIVE
SARS Coronavirus 2 by RT PCR: NEGATIVE

## 2020-10-04 LAB — SEDIMENTATION RATE: Sed Rate: 5 mm/hr (ref 0–15)

## 2020-10-04 LAB — TSH: TSH: 1.265 u[IU]/mL (ref 0.350–4.500)

## 2020-10-04 LAB — PROTEIN AND GLUCOSE, CSF
Glucose, CSF: 69 mg/dL (ref 40–70)
Total  Protein, CSF: 22 mg/dL (ref 15–45)

## 2020-10-04 LAB — CBG MONITORING, ED: Glucose-Capillary: 130 mg/dL — ABNORMAL HIGH (ref 70–99)

## 2020-10-04 LAB — FOLATE: Folate: 10.2 ng/mL (ref >5.9)

## 2020-10-04 LAB — HIV ANTIBODY (ROUTINE TESTING W REFLEX): HIV Screen 4th Generation wRfx: NONREACTIVE

## 2020-10-04 LAB — VITAMIN B12: Vitamin B-12: 182 pg/mL (ref 180–914)

## 2020-10-04 LAB — C-REACTIVE PROTEIN: CRP: 1 mg/dL — ABNORMAL HIGH (ref <1.0)

## 2020-10-04 MED ORDER — SODIUM CHLORIDE 0.9% FLUSH
3.0000 mL | INTRAVENOUS | Status: DC | PRN
  Administered 2020-10-05: 3 mL via INTRAVENOUS

## 2020-10-04 MED ORDER — VITAMIN B-12 1000 MCG PO TABS
1000.0000 ug | ORAL_TABLET | Freq: Every day | ORAL | Status: DC
  Administered 2020-10-05 – 2020-10-08 (×4): 1000 ug via ORAL
  Filled 2020-10-04 (×4): qty 1

## 2020-10-04 MED ORDER — HYDROMORPHONE HCL 1 MG/ML IJ SOLN
1.0000 mg | Freq: Once | INTRAMUSCULAR | Status: AC
  Administered 2020-10-04: 1 mg via INTRAVENOUS
  Filled 2020-10-04: qty 1

## 2020-10-04 MED ORDER — HYDROCODONE-ACETAMINOPHEN 5-325 MG PO TABS
1.0000 | ORAL_TABLET | Freq: Four times a day (QID) | ORAL | Status: DC | PRN
  Administered 2020-10-04 (×2): 1 via ORAL
  Filled 2020-10-04 (×2): qty 1

## 2020-10-04 MED ORDER — ONDANSETRON HCL 4 MG PO TABS: 4.0000 mg | ORAL_TABLET | Freq: Four times a day (QID) | ORAL | Status: DC | PRN

## 2020-10-04 MED ORDER — KETOROLAC TROMETHAMINE 30 MG/ML IJ SOLN
30.0000 mg | Freq: Once | INTRAMUSCULAR | Status: AC
  Administered 2020-10-04: 30 mg via INTRAVENOUS
  Filled 2020-10-04: qty 1

## 2020-10-04 MED ORDER — SODIUM CHLORIDE 0.9 % IV SOLN: INTRAVENOUS | Status: DC

## 2020-10-04 MED ORDER — SODIUM CHLORIDE 0.9 % IV SOLN: 250.0000 mL | INTRAVENOUS | Status: DC | PRN

## 2020-10-04 MED ORDER — GADOBUTROL 1 MMOL/ML IV SOLN
4.0000 mL | Freq: Once | INTRAVENOUS | Status: AC | PRN
  Administered 2020-10-04: 7.5 mL via INTRAVENOUS

## 2020-10-04 MED ORDER — MORPHINE SULFATE (PF) 4 MG/ML IV SOLN
4.0000 mg | Freq: Once | INTRAVENOUS | Status: AC
  Administered 2020-10-04: 4 mg via INTRAVENOUS
  Filled 2020-10-04: qty 1

## 2020-10-04 MED ORDER — ENOXAPARIN SODIUM 40 MG/0.4ML ~~LOC~~ SOLN
40.0000 mg | SUBCUTANEOUS | Status: DC
  Administered 2020-10-04 – 2020-10-09 (×6): 40 mg via SUBCUTANEOUS
  Filled 2020-10-04 (×6): qty 0.4

## 2020-10-04 MED ORDER — ONDANSETRON HCL 4 MG/2ML IJ SOLN
4.0000 mg | Freq: Four times a day (QID) | INTRAMUSCULAR | Status: DC | PRN
  Administered 2020-10-08: 4 mg via INTRAVENOUS
  Filled 2020-10-04: qty 2

## 2020-10-04 MED ORDER — SODIUM CHLORIDE 0.9% FLUSH
3.0000 mL | Freq: Two times a day (BID) | INTRAVENOUS | Status: DC
  Administered 2020-10-04 – 2020-10-10 (×12): 3 mL via INTRAVENOUS

## 2020-10-04 MED ORDER — ACETAMINOPHEN 325 MG PO TABS: 650.0000 mg | ORAL_TABLET | Freq: Four times a day (QID) | ORAL | Status: DC | PRN

## 2020-10-04 MED ORDER — CYCLOBENZAPRINE HCL 10 MG PO TABS
5.0000 mg | ORAL_TABLET | Freq: Three times a day (TID) | ORAL | Status: DC | PRN
  Administered 2020-10-05: 04:00:00 5 mg via ORAL
  Filled 2020-10-04: qty 1

## 2020-10-04 MED ORDER — CYANOCOBALAMIN 1000 MCG/ML IJ SOLN
1000.0000 ug | Freq: Once | INTRAMUSCULAR | Status: DC
  Filled 2020-10-04 (×2): qty 1

## 2020-10-04 MED ORDER — ACETAMINOPHEN 650 MG RE SUPP: 650.0000 mg | Freq: Four times a day (QID) | RECTAL | Status: DC | PRN

## 2020-10-04 MED ORDER — ONDANSETRON HCL 4 MG/2ML IJ SOLN
4.0000 mg | Freq: Once | INTRAMUSCULAR | Status: AC
  Administered 2020-10-04: 4 mg via INTRAVENOUS
  Filled 2020-10-04: qty 2

## 2020-10-04 NOTE — Progress Notes (Addendum)
Pt arrived to 1C bed 118 for continued medical treatment, visitor at bedside, alert and oriented on room air, unable to ambulate safely.  Pt given call bell and urinal and verbalizes appreciation and understanding

## 2020-10-04 NOTE — Plan of Care (Signed)
  Problem: Education: Goal: Knowledge of General Education information will improve Description: Including pain rating scale, medication(s)/side effects and non-pharmacologic comfort measures Outcome: Progressing   Problem: Health Behavior/Discharge Planning: Goal: Ability to manage health-related needs will improve Outcome: Progressing   Problem: Clinical Measurements: Goal: Ability to maintain clinical measurements within normal limits will improve Outcome: Progressing Goal: Will remain free from infection Outcome: Progressing Goal: Diagnostic test results will improve Outcome: Progressing Goal: Respiratory complications will improve Outcome: Progressing Goal: Cardiovascular complication will be avoided Outcome: Progressing   Problem: Nutrition: Goal: Adequate nutrition will be maintained Outcome: Progressing   Problem: Activity: Goal: Risk for activity intolerance will decrease Outcome: Progressing   Problem: Elimination: Goal: Will not experience complications related to bowel motility Outcome: Progressing Goal: Will not experience complications related to urinary retention Outcome: Progressing   Problem: Safety: Goal: Ability to remain free from injury will improve Outcome: Progressing   Problem: Skin Integrity: Goal: Risk for impaired skin integrity will decrease Outcome: Progressing   

## 2020-10-04 NOTE — Consult Note (Addendum)
NEUROLOGY CONSULTATION NOTE   Date of service: October 04, 2020 Patient Name: Brian Summers MRN:  [id number] DOB:  January 01, 1996 Reason for consult: "weakness and falls" _ _ _   _ __   _ __ _ _  __ __   _ __   __ _  History of Present Illness  Brian Summers is a 25 y.o. male with no significant PMH who presents with 1 week history or progressive weakness and numbness.  Symptoms started on last Friday. Felt he had a cramp in R leg, this went down into his shin, then into his R foot. It then felt as if it jumped over to the left shin and left foot.  He reports that the next day, the bottom of his feet became numbe and they hurt when he tries to walk. Also feels like his legs are giving out. He had a fall on Tuesday and then on Thursday but did not hit his head.  The symptoms progressed to involve his finger tips on Sunday. He feels a frost bit in his fingers and toes specially when he takes a shower.  On Tuesday, he was going to [place], when on the airplane, he felt a sharp pain in his left eye, he woke up Wednesday morning and his left eye was shut and he could not open it. This got better itself.  He came back from [place] last night and came to th eED. Reports that his left arm feels weak and it is painful from his left bicep to his neck and then the pain wraps around to the middle of his back on the right.  He denies any fever, chills recently, had a sinus infection a couple weeks ago, no recent signs or symptoms of gastroenteritis. No outdoors activity over the last couple months, not out of the country.  He reports getting headache every day for the last 2 weeks, about twice a day lasting all day.   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 No recent tick bits or rashes. No exposure to heavy metals, lead pain, arsenic. Avoids dairy as he is lactose intolerant.   ROS   Constitutional Denies weight loss, fever and chills.  HEENT Denies changes in vision and hearing.   Respiratory Denies SOB and cough.  CV Denies palpitations and CP  GI Denies abdominal pain, nausea, vomiting and diarrhea.  GU Denies dysuria and urinary frequency.  MSK Endorses myalgia but no joint pain.  Skin Denies rash and pruritus.  Neurological Denies headache and syncope.  Psychiatric Denies recent changes in mood. Denies anxiety and depression.   Past History  History reviewed. No pertinent past medical history. Past Surgical History:  Procedure Laterality Date  . KNEE SURGERY Bilateral    History reviewed. No pertinent family history. Social History   Socioeconomic History  . Marital status: Single    Spouse name: Not on file  . Number of children: Not on file  . Years of education: Not on file  . Highest education level: Not on file  Occupational History  . Not on file  Tobacco Use  . Smoking status: Never Smoker  . Smokeless tobacco: Never Used  Substance and Sexual Activity  . Alcohol use: Never  . Drug use: Never  . Sexual activity: Not on file  Other Topics Concern  . Not on file  Social History Narrative  . Not on file   Social Determinants of Health   Financial Resource Strain: Not on file  Food Insecurity: Not on file  Transportation Needs: Not on file  Physical Activity: Not on file  Stress: Not on file  Social Connections: Not on file   No Known Allergies  Medications   No medications prior to admission.     Vitals   Vitals:   10/04/20 0400 10/04/20 0830 10/04/20 0907 10/04/20 1115  BP: 130/80 121/79 (!) 147/80 (!) 137/96  Pulse: 64 67 62 61  Resp: 16 18 17 17   Temp:   98.2 F (36.8 C) (!) 97.5 F (36.4 C)  TempSrc:      SpO2: 99% 100% 100% 100%  Weight:      Height:         Body mass index is 18.65 kg/m.  Physical Exam   General: Laying comfortably in bed; in no acute distress. HENT: Normal oropharynx and mucosa. Normal external appearance of ears and nose. Neck: Supple, no pain or tenderness CV: No JVD. No  peripheral edema. Pulmonary: Symmetric Chest rise. Normal respiratory effort. Abdomen: Soft to touch, non-tender. Ext: No cyanosis, edema, or deformit Skin: No rash. Normal palpation of skin.  Musculoskeletal: Normal digits and nails by inspection. No clubbing.  Neurologic Examination  Mental status/Cognition: Alert, oriented to self, place, month and year, good attention. Speech/language: Fluent, comprehension intact, object naming intact, repetition intact. Cranial nerves:   CN II Pupils equal and reactive to light, no VF deficits    CN III,IV,VI EOM intact, no gaze preference or deviation, no nystagmus    CN V normal sensation in V1, V2, and V3 segments bilaterally    CN VII no asymmetry, no nasolabial fold flattening    CN VIII normal hearing to speech    CN IX & X normal palatal elevation, no uvular deviation    CN XI 5/5 head turn and 5/5 shoulder shrug bilaterally    CN XII midline tongue protrusion    Motor:  Muscle bulk: normal, tone normal. Mvmt Root Nerve  Muscle Right Left Comments  SA C5/6 Ax Deltoid 4+ 4+ Strength exam limited by pain.  EF C5/6 Mc Biceps 5 5   EE C6/7/8 Rad Triceps 5 5   WF C6/7 Med FCR     WE C7/8 PIN ECU     F Ab C8/T1 U ADM/FDI 4+ 4+   HF L1/2/3 Fem Illopsoas 4+ 4+   KE L2/3/4 Fem Quad 5 5   DF L4/5 D Peron Tib Ant 4 4   PF S1/2 Tibial Grc/Sol 4+ 4+    Reflexes:  Right Left Comments  Pectoralis      Biceps (C5/6) 0 0   Brachioradialis (C5/6) 2 2    Triceps (C6/7) 0 0    Patellar (L3/4) 0 0    Achilles (S1) 0 0    Hoffman - -    Plantar mute mute   Jaw jerk    Sensation:  Light touch Decreased in BL feet, specially at the bottom of his feet. Decreased in BL finger tips.   Pin prick Intact.   Temperature    Vibration intact  Proprioception    Pain with deep palpation of his hamstrings BL.  Coordination/Complex Motor:  - Finger to Nose intact BL - Heel to shin intact BL - Rapid alternating movement are slowed. - Gait:  Deferred.  Labs   CBC:  Recent Labs  Lab 10/04/20 0040  WBC 8.6  NEUTROABS 1.7  HGB 12.5*  HCT 40.0  MCV 75.6*  PLT 246    Basic Metabolic Panel:  Lab Results  Component Value Date   NA 138 10/04/2020   K 3.5 10/04/2020   CO2 26 10/04/2020   GLUCOSE 99 10/04/2020   BUN 8 10/04/2020   CREATININE 0.81 10/04/2020   CALCIUM 8.8 (L) 10/04/2020   GFRNONAA >60 10/04/2020   Lipid Panel: No results found for: LDLCALC HgbA1c: No results found for: HGBA1C Urine Drug Screen:     Component Value Date/Time   LABOPIA NONE DETECTED 05/28/2020 0330   COCAINSCRNUR NONE DETECTED 05/28/2020 0330   LABBENZ NONE DETECTED 05/28/2020 0330   AMPHETMU NONE DETECTED 05/28/2020 0330   THCU POSITIVE (A) 05/28/2020 0330   LABBARB NONE DETECTED 05/28/2020 0330    Alcohol Level     Component Value Date/Time   ETH <10 05/28/2020 0236    MRI Brain w an w/o C: 1. No acute intracranial abnormality. 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 Given the location perhaps this is sequelae of remote trauma. Negative MRI appearance of the brain otherwise. No evidence of demyelination.  2. Mild to moderate new paranasal sinus inflammation since December, most pronounced in the left frontal sinus.  MRI C spine w and w/o C: Negative MRI appearance of the cervical spine aside from mild dextroconvex scoliosis. Normal cervical spinal cord.  No significant degenerative changes.  MRI T Spine w + w/o C: Normal Thoracic Spine MRI.  Significant labs: AST: 76 ALT: 70 Vit B12: 182. Vit D: 15.10  CSF studies: WBC tube 4: 11 WBC tube 1: 19 Protein: 22 Glucose: 69 Gram Stain: No organisms seen CSF Cxs: pending  Impression   Brian Summers is a 25 y.o. male with no significant PMH who presents with 1 week history of progressive neurological symptoms with now weakness in BL legs with associated pain on  deep palpation of his hamstrings and particularly left shoulder along with BL length dependent numbness in finger tips and bottom of his feet and 1 episode of L eye pain and L eye shut that resolved. He also has absent reflexes on exam for the most part except for intact Brachioradialis BL which is 2. Workup so far with imaging with MRI Brain, C, T spine with and w/o contrast with no acute abnormality. CSF profile with pleocytosis, no elevated protein. Most consistent with an inflammatory picture. He has clear mentation with no meningismus on exam. Rest of the significant studies include low normal Vit B12 levels. Did travel [place] where ticks and Lyme is endemic but after his symptoms started. No recent significant illnesses.  The timeline of his symptoms onset, coupled with CSF and imaging and exam findings are very hard to put together into a single process. Presentation is atypical for GBS specially with SF pleocytosis and normal protein. Other considerations on differential include lyme, tick paralysis(no tick on his body on my evaluation of his hair, armpit, neck and back), porphyria neuropathy(however, unlikely to present with sensory symptoms), CMV/EBV/HIV/Syphilis/Cryoglobulinemia(plausible specially with the noted transaminitis), systemic autoimmune disease with secondary neuro involvement including RF, SLE, Sarcoidosis, Sjogren's. Symptoms could also be related to potential vitamin deficiency specially with the noted low normal B12 levels. Will continue Thiamine replacement until vit B1 levels are back. He denies using any recreational drugs, no exposure to heavy metals.  Recommendations  - Vit B1, folate, TSH, SPEP, Heavy metals, Porphyria screen. - ANA with IFA, ACE, Soluble IL2R, DSDNA, ENA. - CMV, EBV, HIV Ab and HIV RNA Cloretta NedQuan, Lyme  Ab and PCR, Cryoglobulinemia, Acute Hepatitis panel. - Oligoclonal Bands and IgG Index. - Autoimmune encephalitis panel on CSF and serum - If the  preliminary testing is non revealing, will consider IVIG 0.4g/Kg daily x 5 days to treat for unknown CNS inflammatory disorder given CSF pleocytosis and progression of neurological symptoms.  Update(1528 on 10/04/20): I spoke to the lab. There is no CSF sample left. Unfortunately, will not be able to run Oligoclonal bands, IgG index, Lyme PCR on CSF and CSF Autoimmune encephalitis panel. Will hold off on repeat LP for now and see if the tests order provide an answer as to the next steps. ______________________________________________________________________   Thank you for the opportunity to take part in the care of this patient. If you have any further questions, please contact the neurology consultation attending.  Signed,  Erick Blinks [facility] Pager Number [phone] _ _ _   _ __   _ __ _ _  __ __   _ __   __ _

## 2020-10-04 NOTE — ED Notes (Signed)
Informed RN bed assigned [phone]

## 2020-10-04 NOTE — H&P (Signed)
History and Physical    [name] SHF:[id number] DOB: September 09, 1995 DOA: 10/03/2020  PCP: System, Provider Not In   Patient coming from: Home  I have personally briefly reviewed patient's old medical records in [facility]  Chief Complaint: Weakness/paresthesia  HPI: Brian Summers is a 25 y.o. male with no significant past medical history who presents to the emergency room for evaluation of bilateral lower extremity weakness, numbness and tingling involving his lower extremities and progression to his upper arms, pain in the back of his neck as well as shoulder pain. Symptoms have been ongoing for the past 1 week and have been progressive.  He also complains of pain in his left leg/thigh with radiation to his shin and now left foot with associated numbness making it difficult for him to ambulate.  Patient states that he fell about 3 days prior to presenting in the emergency room due to having an unsteady gait. He denies having any headache, no difficulty swallowing, no blurred vision, no fever, no chills, no urinary symptoms, no changes in his bowel habits, no chest pain, no shortness of breath, no diaphoresis, no palpitations, no dizziness or lightheadedness. Labs show sodium 138, potassium 3.5, chloride 107, bicarb 26, glucose 99, BUN 8, creatinine 0.81, calcium 8.8, magnesium 1.9, alkaline phosphatase 54, albumin 3.9, AST 76, ALT 70, total protein 7.5, total CK4 86, folic acid 10.2, CRP 1.0, lactic acid 1.1, vitamin B12 182, total CK4 86, white count 8.6, hemoglobin 12.5, hematocrit 40.0, MCV 75.6, RDW 14.2, platelet count 246, sed rate 5, TSH 1.26 CSF culture and gram stain pending Respiratory viral panel is negative MRI of the brain/cervical spine without contrast shows no acute intracranial abnormality. 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 Given the location perhaps this is sequelae of  remote trauma. Negative MRI appearance of the brain otherwise. No evidence of demyelination.Mild to moderate new paranasal sinus inflammation since 04-Jul-2023, most pronounced in the left frontal sinus. MRI of the thoracic spine is negative    ED Course: Patient is a 25 year old African-American male who presents to the emergency room for evaluation of a 1 week history of bilateral lower extremity weakness, pain, numbness and tingling with radiation to his upper arms as well as complaints of neck pain.  He denies having any recent trauma. Due to concerns for possible multiple sclerosis patient had an MRI of the brain which was negative for any demyelination abnormality.  He was seen in consultation by teleneurology who made ordered spinal tap.  Patient was noted to have low vitamin B12 levels as well.  He is status post fall and has an unsteady gait. He will be admitted to the hospital for further evaluation and treatment.   Review of Systems: As per HPI otherwise all other systems reviewed and negative.    History reviewed. No pertinent past medical history.  Past Surgical History:  Procedure Laterality Date  . KNEE SURGERY Bilateral      reports that he has never smoked. He has never used smokeless tobacco. He reports that he does not drink alcohol and does not use drugs.  No Known Allergies  History reviewed. No pertinent family history.    Prior to Admission medications   Not on File    Physical Exam: Vitals:   10/04/20 0330 10/04/20 0400 10/04/20 0830 10/04/20 0907  BP: (!) 143/91 130/80 121/79 (!) 147/80  Pulse: 63 64 67 62  Resp:  16 18 17   Temp:    98.2 F (36.8 C)  TempSrc:      SpO2: 100% 99% 100% 100%  Weight:      Height:         Vitals:   10/04/20 0330 10/04/20 0400 10/04/20 0830 10/04/20 0907  BP: (!) 143/91 130/80 121/79 (!) 147/80  Pulse: 63 64 67 62  Resp:  16 18 17   Temp:    98.2 F (36.8 C)  TempSrc:      SpO2: 100% 99% 100% 100%  Weight:       Height:          Constitutional: Alert and oriented x 3 . Not in any apparent distress HEENT:      Head: Normocephalic and atraumatic.         Eyes: PERLA, EOMI, Conjunctivae are normal. Sclera is non-icteric.       Mouth/Throat: Mucous membranes are moist.       Neck: Supple with no signs of meningismus. Cardiovascular: Regular rate and rhythm. No murmurs, gallops, or rubs. 2+ symmetrical distal pulses are present . No JVD. No LE edema Respiratory: Respiratory effort normal .Lungs sounds clear bilaterally. No wheezes, crackles, or rhonchi.  Gastrointestinal: Soft, non tender, and non distended with positive bowel sounds.  Genitourinary: No CVA tenderness. Musculoskeletal:  Able to move extremities but limited due to pain especially his upper extremities. No cyanosis, or erythema of extremities. Neurologic:  Face is symmetric. Moving all extremities. No gross focal neurologic deficits  Skin: Skin is warm, dry.  No rash or ulcers Psychiatric: Mood and affect are normal   Labs on Admission: I have personally reviewed following labs and imaging studies  CBC: Recent Labs  Lab 10/04/20 0040  WBC 8.6  NEUTROABS 1.7  HGB 12.5*  HCT 40.0  MCV 75.6*  PLT 246   Basic Metabolic Panel: Recent Labs  Lab 10/04/20 0040  NA 138  K 3.5  CL 107  CO2 26  GLUCOSE 99  BUN 8  CREATININE 0.81  CALCIUM 8.8*  MG 1.9   GFR: Estimated Creatinine Clearance: 119.4 mL/min (by C-G formula based on SCr of 0.81 mg/dL). Liver Function Tests: Recent Labs  Lab 10/04/20 0040  AST 76*  ALT 70*  ALKPHOS 54  BILITOT 0.7  PROT 7.5  ALBUMIN 3.9   No results for input(s): LIPASE, AMYLASE in the last 168 hours. No results for input(s): AMMONIA in the last 168 hours. Coagulation Profile: No results for input(s): INR, PROTIME in the last 168 hours. Cardiac Enzymes: Recent Labs  Lab 10/04/20 0040  CKTOTAL 486*   BNP (last 3 results) No results for input(s): PROBNP in the last 8760  hours. HbA1C: No results for input(s): HGBA1C in the last 72 hours. CBG: Recent Labs  Lab 10/04/20 0058  GLUCAP 130*   Lipid Profile: No results for input(s): CHOL, HDL, LDLCALC, TRIG, CHOLHDL, LDLDIRECT in the last 72 hours. Thyroid Function Tests: Recent Labs    10/04/20 0040  TSH 1.265   Anemia Panel: Recent Labs    10/04/20 0157  VITAMINB12 182  FOLATE 10.2   Urine analysis:    Component Value Date/Time   COLORURINE YELLOW (A) 10/04/2020 0157   APPEARANCEUR CLEAR (A) 10/04/2020 0157   LABSPEC 1.015 10/04/2020 0157   PHURINE 6.0 10/04/2020 0157   GLUCOSEU NEGATIVE 10/04/2020 0157   HGBUR NEGATIVE 10/04/2020 0157   BILIRUBINUR NEGATIVE 10/04/2020 0157   KETONESUR NEGATIVE 10/04/2020 0157   PROTEINUR NEGATIVE 10/04/2020 0157  NITRITE NEGATIVE 10/04/2020 0157   LEUKOCYTESUR NEGATIVE 10/04/2020 0157    Radiological Exams on Admission: MR Brain W and Wo Contrast  Result Date: 10/04/2020 CLINICAL DATA:  25 year old male with 1 week of bilateral leg weakness and numbness, symptom progression to upper extremities. EXAM: MRI HEAD WITHOUT AND WITH CONTRAST TECHNIQUE: Multiplanar, multiecho pulse sequences of the brain and surrounding structures were obtained without and with intravenous contrast. CONTRAST:  7.615mL GADAVIST GADOBUTROL 1 MMOL/ML IV SOLN COMPARISON:  Head CT 05/28/2020. FINDINGS: Mild susceptibility artifact along the anterior vertex of unclear etiology. Brain: Focal cortical encephalomalacia (series 17, image 109) or less likely congenital sulcal variant along the anterior left superior frontal gyrus. T2 appearance also favors chronic encephalomalacia (series 18, image 23), and this is stable from the December CT. No superimposed restricted diffusion to suggest acute infarction. No midline shift, mass effect, evidence of mass lesion, ventriculomegaly, extra-axial collection or acute intracranial hemorrhage. Cervicomedullary junction and pituitary are within normal  limits. Wallace CullensGray and white matter signal appears normal outside of the anterior left superior frontal gyrus. No chronic cerebral blood products are evident. No cerebral white matter lesions. Deep gray matter nuclei, brainstem and cerebellum appear normal. No abnormal enhancement identified. No dural thickening. There is a small developmental venous anomaly in proximity to the abnormal anterior left frontal lobe (series 19, image 105), normal variant. Vascular: Major intracranial vascular flow voids are preserved. The major dural venous sinuses are enhancing and appear to be patent. Skull and upper cervical spine: Normal visible cervical spine. Visualized bone marrow signal is within normal limits. Sinuses/Orbits: Hyperplastic paranasal sinuses. Heterogeneous T1/FLAIR/T2 material now in the lateral portion of the left frontal sinus compatible with inflammatory sinus disease. Mild associated mucosal thickening at the left frontoethmoidal recess. Mild mucosal thickening also in the alveolar recess of the right maxillary sinus now. The other sinuses remain clear. Orbits appear negative. Other: Mastoids are clear. Visible internal auditory structures appear normal. IMPRESSION: 1. No acute intracranial abnormality. 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 Given the location perhaps this is sequelae of remote trauma. Negative MRI appearance of the brain otherwise. No evidence of demyelination. 2. Mild to moderate new paranasal sinus inflammation since December, most pronounced in the left frontal sinus. Electronically Signed   By: Odessa FlemingH  Hall M.D.   On: 10/04/2020 06:07   MR Cervical Spine W or Wo Contrast  Result Date: 10/04/2020 CLINICAL DATA:  25 year old male with 1 week of bilateral leg weakness and numbness, symptom progression to upper extremities. EXAM: MRI CERVICAL SPINE WITHOUT AND WITH CONTRAST TECHNIQUE: Multiplanar and  multiecho pulse sequences of the cervical spine, to include the craniocervical junction and cervicothoracic junction, were obtained without and with intravenous contrast. CONTRAST:  7.405mL GADAVIST GADOBUTROL 1 MMOL/ML IV SOLN in conjunction with contrast enhanced imaging of the brain reported separately. COMPARISON:  Brain MRI the same day reported separately. FINDINGS: Alignment: Straightening of cervical lordosis. Subtle dextroconvex cervical scoliosis. No spondylolisthesis. Vertebrae: No marrow edema or evidence of acute osseous abnormality. Visualized bone marrow signal is within normal limits. Cord: Normal. No spinal cord signal abnormality or volume loss identified. No cord expansion. No abnormal intradural enhancement. No dural thickening. Posterior Fossa, vertebral arteries, paraspinal tissues: Cervicomedullary junction is within normal limits. Brain reported separately today. Preserved major vascular flow voids in the neck. The left vertebral artery appears somewhat dominant. Negative visible neck soft tissues, lung apices. Disc levels: Capacious spinal canal.  No significant cervical spine degeneration. No spinal stenosis or convincing neural impingement. IMPRESSION: Negative MRI appearance of the cervical spine aside from mild dextroconvex scoliosis. Normal cervical spinal cord.  No significant degenerative changes. Electronically Signed   By: Odessa Fleming M.D.   On: 10/04/2020 06:11   MR THORACIC SPINE W WO CONTRAST  Result Date: 10/04/2020 CLINICAL DATA:  25 year old male with 1 week of bilateral leg weakness and numbness, symptom progression to upper extremities. EXAM: MRI THORACIC WITHOUT AND WITH CONTRAST TECHNIQUE: Multiplanar and multiecho pulse sequences of the thoracic spine were obtained without and with intravenous contrast. CONTRAST:  7.57mL GADAVIST GADOBUTROL 1 MMOL/ML IV SOLN in conjunction with contrast enhanced imaging of the brain and cervical spine reported separately. COMPARISON:  Brain  and cervical spine today reported separately. FINDINGS: Limited cervical spine imaging:  Detailed separately today. Thoracic spine segmentation:  Appears normal. Alignment: Normal thoracic kyphosis. Minimal levoconvex upper thoracic scoliosis. No spondylolisthesis. Vertebrae: No marrow edema or evidence of acute osseous abnormality. Visualized bone marrow signal is within normal limits. Cord: Normal thoracic spinal cord. No cord signal abnormality or volume loss identified. No abnormal intradural enhancement or dural thickening. Capacious thoracic spinal canal throughout. The conus medullaris appears normal at T12-L1. Paraspinal and other soft tissues: Negative. Disc levels: Negative. No thoracic spine degeneration or neural impingement identified. IMPRESSION: Normal Thoracic Spine MRI. Electronically Signed   By: Odessa Fleming M.D.   On: 10/04/2020 06:16     Assessment/Plan Principal Problem:   Weakness Active Problems:   Transaminitis     Weakness Patient presents for evaluation of bilateral lower extremity weakness, numbness and tingling with progression to his upper extremities, and unsteady gait and an episode of fall prior to his admission. Etiology of his symptoms is unclear at this time MRI is negative for demyelinating lesion ??  Symptoms related to low vitamin B12 Follow-up results of spinal fluid studies We will request neurology consult Supportive care Fall precautions   Elevated liver enzymes Unclear etiology Patient not on any medications at home and denies alcohol abuse We will monitor closely  DVT prophylaxis: Lovenox Code Status: full code Family Communication: Greater than 50% of time was spent discussing plan of care with patient and his mother at the bedside.  All questions and concerns have been addressed.  They verbalized understanding and agree with the plan. Disposition Plan: Back to previous home environment Consults called: Neurology Status: At the time of  admission, it appears that the appropriate admission status for this patient is inpatient. This is judged to be reasonable and necessary in order to provide the required intensity of service to ensure the patient's safety given the presenting symptoms, physical exam findings, and initial radiographic and laboratory data in the context of their comorbid conditions. Patient requires inpatient status due to high intensity of service, high risk for further deterioration and high frequency of surveillance required.    Lucile Shutters MD [facility]     10/04/2020, 9:41 AM

## 2020-10-04 NOTE — Consult Note (Addendum)
[facility] Tele[facility]urology Consult Services  Stat Consult  Date of Service:   10/04/2020 00:19:03  Diagnosis:     .  R53.1 - Weakness     .  R20.2 - Paresthesia of skin  Impression: 25yoM presented with 1 wk history of bilateral leg weakness, numbness, progress to arms weakness/numbness. Given age and constellation of symptoms, concern for MS.   Recommendation  -- MR brain, cervical spine, thoracic spine w/wo contrast  -- Lumbar puncture with cell count, MS panel, EBV IgG/IgM, meningitis panel.  -- Serum Vitamin D level, Vitamin B12 level  -- If MRI shows lesion enhanced by contrast suspecting for MS lesion, consider starting high dose steroid Solu-Medrol 1000mg  daily for 3-5 days + GI ppx  --------------------------------------------------------- Addendum Inform by ED that patient's MRI brain, cervical, thoracic spine is unremarkable.  It is notable however that patient has low vitamin B12 level (182), which may explain his neurological symptoms. He has mild anemia (Hgb 12.5, MCV 75.6). Please review with patient if he has any dietary restriction or has recently used nitrous oxide (whippets).   Due to his low vitamin B12 level and neurological symptoms, recommend following dosage for Vitamin B12 supplement:  -- During the first week, 1013mcg one to three times weekly or once daily; then 1033mcg once per week up to 4 weeks.  -- He will need close follow up for resolution of symptoms. If symptom improving, may continue for up to 3 months.   If patient does not have any known factor that can cause B12 deficiency, recommend further workup including MMA, homocysteine, autoantibodies to intrinsic factor.   Recommend follow up with neurology specialize in neuromuscular or neurophysiology for EMG/NCS testing to determine if there are any peripheral nerve damage. And to follow up on LP result.   Please have PT evaluate the patient for ambulation safety.    ---------------------------------------------------------  Our recommendations are outlined below.  Diagnostic Studies: MRI brain w/wo contrast Lumbar puncture to be done under fluoroscopy  Laboratory Studies: Check B12/folate TSH ESR/CRP CSF Cell count with diff, protein, glucose, gram stain, culture, HSV PCR, VDRL, save sample for additional testing  Disposition: Neurology will follow   Imaging: [facility] neg for acute abnormalities  Metrics: [facility] Notification Time: 10/04/2020 00:17:07 Stamp Time: 10/04/2020 00:19:03 Callback Response Time: 10/04/2020 00:20:27   ----------------------------------------------------------------------------------------------------  Chief Complaint: multiple neurological complaints  History of Present Illness: Patient is a 25 year old Male.  Starting last Friday he was having right thigh with cramping-like sensation, going down to right shin. Sunday morning it went from left shin. Then he started having finger tip numbness. He also has pain at lower back on the right side, pain at neck to left. He experience weakness of legs, stumbles, and hurts when he walks. Another day when he woke up his eyes were droopy/shut. He also feels body pain. Before Friday no symptoms at all. He also noticed weakness of arms, and been dropping things. Everything feels heavy to him. When he touches hot water, feels like frostbite. Denies previous episode. Denies travel, recent illness, trauma. 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 He had multiple test done and was PMH: none PSH: knee surgery as a child FH: father has epilepsy, HTN, DM. Denies hx of autoimmune. SH: Works as [employer] Denies alcohol or Tobacco use.      Examination: BP(145/93), Pulse(63), Blood Glucose(130)  Neuro Exam: General: Alert,Awake, Oriented to Time, Place, Person  Speech: Fluent:  Language: Intact:  Face: Symmetric:  Facial Sensation: Intact: Right,  Left  Extraocular Movements: Intact: Right, Left  Motor Exam: Drift: LUE, LLE  Sensation: Reduced: LUE  Coordination: Intact:     Patient / Family was informed the Neurology Consult would occur via TeleHealth consult by way of interactive audio and video telecommunications and consented to receiving care in this manner.  Patient is being evaluated for possible acute neurologic impairment and high probability of imminent or life - threatening deterioration.I spent total of 20 minutes providing care to this patient, including time for face to face visit via telemedicine, review of medical records, imaging studies and discussion of findings with providers, the patient and / or family.   Dr Edward Jolly   [facility] [phone]  Case [id number]

## 2020-10-05 LAB — HEPATITIS PANEL, ACUTE
HCV Ab: NONREACTIVE
Hep A IgM: NONREACTIVE
Hep B C IgM: NONREACTIVE
Hepatitis B Surface Ag: NONREACTIVE

## 2020-10-05 LAB — BASIC METABOLIC PANEL
Anion gap: 8 (ref 5–15)
BUN: 7 mg/dL (ref 6–20)
CO2: 26 mmol/L (ref 22–32)
Calcium: 9.3 mg/dL (ref 8.9–10.3)
Chloride: 102 mmol/L (ref 98–111)
Creatinine, Ser: 1.03 mg/dL (ref 0.61–1.24)
GFR, Estimated: >60 mL/min (ref >60)
Glucose, Bld: 95 mg/dL (ref 70–99)
Potassium: 4.1 mmol/L (ref 3.5–5.1)
Sodium: 136 mmol/L (ref 135–145)

## 2020-10-05 LAB — RPR: RPR Ser Ql: NONREACTIVE

## 2020-10-05 LAB — CBC
HCT: 40.9 % (ref 39.0–52.0)
Hemoglobin: 13.3 g/dL (ref 13.0–17.0)
MCH: 24.1 pg — ABNORMAL LOW (ref 26.0–34.0)
MCHC: 32.5 g/dL (ref 30.0–36.0)
MCV: 74.2 fL — ABNORMAL LOW (ref 80.0–100.0)
Platelets: 254 10*3/uL (ref 150–400)
RBC: 5.51 MIL/uL (ref 4.22–5.81)
RDW: 14.3 % (ref 11.5–15.5)
WBC: 8 10*3/uL (ref 4.0–10.5)
nRBC: 0 % (ref 0.0–0.2)

## 2020-10-05 MED ORDER — CYCLOBENZAPRINE HCL 10 MG PO TABS
10.0000 mg | ORAL_TABLET | Freq: Three times a day (TID) | ORAL | Status: DC | PRN
  Administered 2020-10-05 – 2020-10-09 (×10): 10 mg via ORAL
  Filled 2020-10-05 (×10): qty 1

## 2020-10-05 MED ORDER — KETOROLAC TROMETHAMINE 15 MG/ML IJ SOLN
15.0000 mg | Freq: Three times a day (TID) | INTRAMUSCULAR | Status: AC | PRN
  Filled 2020-10-05: qty 1

## 2020-10-05 MED ORDER — ACETAMINOPHEN 500 MG PO TABS
1000.0000 mg | ORAL_TABLET | Freq: Three times a day (TID) | ORAL | Status: DC | PRN
  Administered 2020-10-07 – 2020-10-10 (×3): 1000 mg via ORAL
  Filled 2020-10-05 (×3): qty 2

## 2020-10-05 NOTE — Plan of Care (Signed)
Shift Summary: Pt orientedx4, VSS, remains on RA. C/o pain to left shoulder radiating to neck, difficult to describe per pt but "like a deep muscle ache." PRN hydrocodone and flexeril given with relief to "bearable." Fall/safety precautions maintained, rounding performed, needs/concerns addressed.  Problem: Education: Goal: Knowledge of General Education information will improve Description: Including pain rating scale, medication(s)/side effects and non-pharmacologic comfort measures Outcome: Progressing   Problem: Health Behavior/Discharge Planning: Goal: Ability to manage health-related needs will improve Outcome: Progressing   Problem: Clinical Measurements: Goal: Ability to maintain clinical measurements within normal limits will improve Outcome: Progressing Goal: Will remain free from infection Outcome: Progressing Goal: Diagnostic test results will improve Outcome: Progressing Goal: Respiratory complications will improve Outcome: Progressing Goal: Cardiovascular complication will be avoided Outcome: Progressing   Problem: Activity: Goal: Risk for activity intolerance will decrease Outcome: Progressing   Problem: Nutrition: Goal: Adequate nutrition will be maintained Outcome: Progressing   Problem: Coping: Goal: Level of anxiety will decrease Outcome: Progressing   Problem: Elimination: Goal: Will not experience complications related to bowel motility Outcome: Progressing Goal: Will not experience complications related to urinary retention Outcome: Progressing   Problem: Pain Managment: Goal: General experience of comfort will improve Outcome: Progressing   Problem: Skin Integrity: Goal: Risk for impaired skin integrity will decrease Outcome: Progressing   Problem: Safety: Goal: Ability to remain free from injury will improve Outcome: Progressing

## 2020-10-05 NOTE — Progress Notes (Signed)
PROGRESS NOTE    Brian Summers  JJK:[id number] DOB: 1995-07-25 DOA: 10/03/2020 PCP: System, Provider Not In  118A/118A-AA   Assessment & Plan:   Principal Problem:   Weakness Active Problems:   Transaminitis   Brian Summers is a 25 y.o. male with no significant past medical history who presents to the emergency room for evaluation of bilateral lower extremity weakness, numbness and tingling involving his lower extremities and progression to his upper arms, pain in the back of his neck as well as shoulder pain. Symptoms have been ongoing for the past 1 week and have been progressive.  He also complains of pain in his left leg/thigh with radiation to his shin and now left foot with associated numbness making it difficult for him to ambulate.  Patient states that he fell about 3 days prior to presenting in the emergency room due to having an unsteady gait.  # Weakness and paresthesia in all extremities  --Patient presents for evaluation of bilateral lower extremity weakness, numbness and tingling with progression to his upper extremities, and unsteady gait and an episode of fall prior to his admission. --MRI is negative for demyelinating lesion --Neuro consulted, has concern for an underlying CNS inflammatory disorder given the elevated WBC count in CSF. Plan: --Neuro ordered the following: - Vit B1, folate, TSH, SPEP, Heavy metals, Porphyria screen. - ANA with IFA, ACE, Soluble IL2R, DSDNA, ENA. - CMV, EBV, HIV Ab and HIV RNA Quan, Lyme Ab and PCR, Cryoglobulinemia, Acute Hepatitis panel. - Autoimmune encephalitis panel on serum - Unable to get Oligoclonal Bands, IgG Index, CSF autoimmune panel as out of CSF. - Continue Vit B12 supplementation. --If symptoms improve, will hold off IVIG --tylenol, IV toradol, flexeril for pain and muscle ache.  Avoid opioids.   DVT prophylaxis: Lovenox SQ Code Status: Full code  Family Communication:  Level of care: Med-Surg Dispo:   The patient  is from: home Anticipated d/c is to: home Anticipated d/c date is: 1-2 days Patient currently is not medically ready to d/c due to: pending neuro eval and clearance   Subjective and Interval History:  Pt reported continued numbness in his feet, but improved in his UE's.  Was able to walk with walker.     Objective: Vitals:   10/05/20 1137 10/05/20 1522 10/05/20 2040 10/05/20 2343  BP: 135/88 137/79 127/77 124/78  Pulse: 87 85 73 79  Resp: 18 18 20 17   Temp: 98.5 F (36.9 C) 98.3 F (36.8 C) 98.2 F (36.8 C) 97.9 F (36.6 C)  TempSrc:   Oral Oral  SpO2: 100% 100% 100% 100%  Weight:      Height:        Intake/Output Summary (Last 24 hours) at 10/06/2020 0102 Last data filed at 10/05/2020 1602 Gross per 24 hour  Intake 120 ml  Output 450 ml  Net -330 ml   Filed Weights   10/03/20 2341  Weight: 59 kg    Examination:   Constitutional: NAD, AAOx3 HEENT: conjunctivae and lids normal, EOMI CV: No cyanosis.   RESP: normal respiratory effort, on RA Extremities: No effusions, edema in BLE SKIN: warm, dry Neuro: II - XII grossly intact.   Psych: Normal mood and affect.  Appropriate judgement and reason   Data Reviewed: I have personally reviewed following labs and imaging studies  CBC: Recent Labs  Lab 10/04/20 0040 10/05/20 0353  WBC 8.6 8.0  NEUTROABS 1.7  --   HGB 12.5* 13.3  HCT 40.0 40.9  MCV 75.6* 74.2*  PLT 246 254   Basic Metabolic Panel: Recent Labs  Lab 10/04/20 0040 10/05/20 0353  NA 138 136  K 3.5 4.1  CL 107 102  CO2 26 26  GLUCOSE 99 95  BUN 8 7  CREATININE 0.81 1.03  CALCIUM 8.8* 9.3  MG 1.9  --    GFR: Estimated Creatinine Clearance: 93.9 mL/min (by C-G formula based on SCr of 1.03 mg/dL). Liver Function Tests: Recent Labs  Lab 10/04/20 0040  AST 76*  ALT 70*  ALKPHOS 54  BILITOT 0.7  PROT 7.5  ALBUMIN 3.9   No results for input(s): LIPASE, AMYLASE in the last 168 hours. No results for input(s): AMMONIA in the last 168  hours. Coagulation Profile: No results for input(s): INR, PROTIME in the last 168 hours. Cardiac Enzymes: Recent Labs  Lab 10/04/20 0040  CKTOTAL 486*   BNP (last 3 results) No results for input(s): PROBNP in the last 8760 hours. HbA1C: No results for input(s): HGBA1C in the last 72 hours. CBG: Recent Labs  Lab 10/04/20 0058  GLUCAP 130*   Lipid Profile: No results for input(s): CHOL, HDL, LDLCALC, TRIG, CHOLHDL, LDLDIRECT in the last 72 hours. Thyroid Function Tests: Recent Labs    10/04/20 0040  TSH 1.265   Anemia Panel: Recent Labs    10/04/20 0157  VITAMINB12 182  FOLATE 10.2   Sepsis Labs: No results for input(s): PROCALCITON, LATICACIDVEN in the last 168 hours.  Recent Results (from the past 240 hour(s))  Resp Panel by RT-PCR (Flu A&B, Covid) Nasopharyngeal Swab     Status: None   Collection Time: 10/04/20 12:40 AM   Specimen: Nasopharyngeal Swab; Nasopharyngeal(NP) swabs in vial transport medium  Result Value Ref Range Status   SARS Coronavirus 2 by RT PCR NEGATIVE NEGATIVE Final    Comment: (NOTE) SARS-CoV-2 target nucleic acids are NOT DETECTED.  The SARS-CoV-2 RNA is generally detectable in upper respiratory specimens during the acute phase of infection. The lowest concentration of SARS-CoV-2 viral copies this assay can detect is 138 copies/mL. A negative result does not preclude SARS-Cov-2 infection and should not be used as the sole basis for treatment or other patient management decisions. A negative result may occur with  improper specimen collection/handling, submission of specimen other than nasopharyngeal swab, presence of viral mutation(s) within the areas targeted by this assay, and inadequate number of viral copies(<138 copies/mL). A negative result must be combined with clinical observations, patient history, and epidemiological information. The expected result is Negative.  Fact Sheet for Patients:   BloggerCourse.com  Fact Sheet for Healthcare Providers:  SeriousBroker.it  This test is no t yet approved or cleared by the [place] FDA and  has been authorized for detection and/or diagnosis of SARS-CoV-2 by FDA under an Emergency Use Authorization (EUA). This EUA will remain  in effect (meaning this test can be used) for the duration of the COVID-19 declaration under Section 564(b)(1) of the Act, 21 U.S.C.section 360bbb-3(b)(1), unless the authorization is terminated  or revoked sooner.       Influenza A by PCR NEGATIVE NEGATIVE Final   Influenza B by PCR NEGATIVE NEGATIVE Final    Comment: (NOTE) The Xpert Xpress SARS-CoV-2/FLU/RSV plus assay is intended as an aid in the diagnosis of influenza from Nasopharyngeal swab specimens and should not be used as a sole basis for treatment. Nasal washings and aspirates are unacceptable for Xpert Xpress SARS-CoV-2/FLU/RSV testing.  Fact Sheet for Patients: BloggerCourse.com  Fact Sheet for Healthcare Providers: SeriousBroker.it  This test  is not yet approved or cleared by the [place] and has been authorized for detection and/or diagnosis of SARS-CoV-2 by FDA under an Emergency Use Authorization (EUA). This EUA will remain in effect (meaning this test can be used) for the duration of the COVID-19 declaration under Section 564(b)(1) of the Act, 21 U.S.C. section 360bbb-3(b)(1), unless the authorization is terminated or revoked.  Performed at [facility], [address] [place], [place] [address]   CSF culture w Gram Stain     Status: None (Preliminary result)   Collection Time: 10/04/20  6:37 AM   Specimen: Lumbar Puncture; Cerebrospinal Fluid  Result Value Ref Range Status   Specimen Description LUMBAR  Final   Special Requests NONE  Final   Gram Stain   Final    NO ORGANISMS SEEN WBC SEEN RBC  SEEN Performed at [facility], [address], [place], [place] [address]    Culture PENDING  Incomplete   Report Status PENDING  Incomplete      Radiology Studies: MR Brain W and Wo Contrast  Result Date: 10/04/2020 CLINICAL DATA:  25 year old male with 1 week of bilateral leg weakness and numbness, symptom progression to upper extremities. EXAM: MRI HEAD WITHOUT AND WITH CONTRAST TECHNIQUE: Multiplanar, multiecho pulse sequences of the brain and surrounding structures were obtained without and with intravenous contrast. CONTRAST:  7.72mL GADAVIST GADOBUTROL 1 MMOL/ML IV SOLN COMPARISON:  Head CT 05/28/2020. FINDINGS: Mild susceptibility artifact along the anterior vertex of unclear etiology. Brain: Focal cortical encephalomalacia (series 17, image 109) or less likely congenital sulcal variant along the anterior left superior frontal gyrus. T2 appearance also favors chronic encephalomalacia (series 18, image 23), and this is stable from the 06-03-23 CT. No superimposed restricted diffusion to suggest acute infarction. No midline shift, mass effect, evidence of mass lesion, ventriculomegaly, extra-axial collection or acute intracranial hemorrhage. Cervicomedullary junction and pituitary are within normal limits. Wallace Cullens and white matter signal appears normal outside of the anterior left superior frontal gyrus. No chronic cerebral blood products are evident. No cerebral white matter lesions. Deep gray matter nuclei, brainstem and cerebellum appear normal. No abnormal enhancement identified. No dural thickening. There is a small developmental venous anomaly in proximity to the abnormal anterior left frontal lobe (series 19, image 105), normal variant. Vascular: Major intracranial vascular flow voids are preserved. The major dural venous sinuses are enhancing and appear to be patent. Skull and upper cervical spine: Normal visible cervical spine. Visualized bone marrow signal is within normal limits.  Sinuses/Orbits: Hyperplastic paranasal sinuses. Heterogeneous T1/FLAIR/T2 material now in the lateral portion of the left frontal sinus compatible with inflammatory sinus disease. Mild associated mucosal thickening at the left frontoethmoidal recess. Mild mucosal thickening also in the alveolar recess of the right maxillary sinus now. The other sinuses remain clear. Orbits appear negative. Other: Mastoids are clear. Visible internal auditory structures appear normal. IMPRESSION: 1. No acute intracranial abnormality. 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 Given the location perhaps this is sequelae of remote trauma. Negative MRI appearance of the brain otherwise. No evidence of demyelination. 2. Mild to moderate new paranasal sinus inflammation since Jun 03, 2023, most pronounced in the left frontal sinus. Electronically Signed   By: Odessa Fleming M.D.   On: 10/04/2020 06:07   MR Cervical Spine W or Wo Contrast  Result Date: 10/04/2020 CLINICAL DATA:  25 year old male with 1 week of bilateral leg weakness and numbness,  symptom progression to upper extremities. EXAM: MRI CERVICAL SPINE WITHOUT AND WITH CONTRAST TECHNIQUE: Multiplanar and multiecho pulse sequences of the cervical spine, to include the craniocervical junction and cervicothoracic junction, were obtained without and with intravenous contrast. CONTRAST:  7.185mL GADAVIST GADOBUTROL 1 MMOL/ML IV SOLN in conjunction with contrast enhanced imaging of the brain reported separately. COMPARISON:  Brain MRI the same day reported separately. FINDINGS: Alignment: Straightening of cervical lordosis. Subtle dextroconvex cervical scoliosis. No spondylolisthesis. Vertebrae: No marrow edema or evidence of acute osseous abnormality. Visualized bone marrow signal is within normal limits. Cord: Normal. No spinal cord signal abnormality or volume loss identified. No cord expansion. No  abnormal intradural enhancement. No dural thickening. Posterior Fossa, vertebral arteries, paraspinal tissues: Cervicomedullary junction is within normal limits. Brain reported separately today. Preserved major vascular flow voids in the neck. The left vertebral artery appears somewhat dominant. Negative visible neck soft tissues, lung apices. Disc levels: Capacious spinal canal. No significant cervical spine degeneration. No spinal stenosis or convincing neural impingement. IMPRESSION: Negative MRI appearance of the cervical spine aside from mild dextroconvex scoliosis. Normal cervical spinal cord.  No significant degenerative changes. Electronically Signed   By: Odessa FlemingH  Hall M.D.   On: 10/04/2020 06:11   MR THORACIC SPINE W WO CONTRAST  Result Date: 10/04/2020 CLINICAL DATA:  25 year old male with 1 week of bilateral leg weakness and numbness, symptom progression to upper extremities. EXAM: MRI THORACIC WITHOUT AND WITH CONTRAST TECHNIQUE: Multiplanar and multiecho pulse sequences of the thoracic spine were obtained without and with intravenous contrast. CONTRAST:  7.245mL GADAVIST GADOBUTROL 1 MMOL/ML IV SOLN in conjunction with contrast enhanced imaging of the brain and cervical spine reported separately. COMPARISON:  Brain and cervical spine today reported separately. FINDINGS: Limited cervical spine imaging:  Detailed separately today. Thoracic spine segmentation:  Appears normal. Alignment: Normal thoracic kyphosis. Minimal levoconvex upper thoracic scoliosis. No spondylolisthesis. Vertebrae: No marrow edema or evidence of acute osseous abnormality. Visualized bone marrow signal is within normal limits. Cord: Normal thoracic spinal cord. No cord signal abnormality or volume loss identified. No abnormal intradural enhancement or dural thickening. Capacious thoracic spinal canal throughout. The conus medullaris appears normal at T12-L1. Paraspinal and other soft tissues: Negative. Disc levels: Negative. No  thoracic spine degeneration or neural impingement identified. IMPRESSION: Normal Thoracic Spine MRI. Electronically Signed   By: Odessa FlemingH  Hall M.D.   On: 10/04/2020 06:16     Scheduled Meds: . cyanocobalamin  1,000 mcg Intramuscular Once  . enoxaparin (LOVENOX) injection  40 mg Subcutaneous Q24H  . sodium chloride flush  3 mL Intravenous Q12H  . vitamin B-12  1,000 mcg Oral Daily   Continuous Infusions: . sodium chloride       LOS: 2 days     Darlin Priestlyina La Dibella, MD [facility] If 7PM-7AM, please contact night-coverage 10/06/2020, 1:02 AM

## 2020-10-05 NOTE — Progress Notes (Signed)
NEUROLOGY CONSULTATION PROGRESS NOTE   Date of service: October 05, 2020 Patient Name: Brian Summers MRN:  [id number] DOB:  Aug 17, 1995  Brief HPI   Brian Summers is a 25 y.o. male with no significant PMH who presents with 1 week history of progressive neurological symptoms with now weakness in BL legs with associated pain on deep palpation of his hamstrings and particularly left shoulder along with BL length dependent numbness in finger tips and bottom of his feet and 1 episode of L eye pain and L eye shut that resolved. He also has absent reflexes on exam for the most part except for intact Brachioradialis BL which is 2. Workup so far with imaging with MRI Brain, C, T spine with and w/o contrast with no acute abnormality. CSF profile with pleocytosis, no elevated protein. Most consistent with an inflammatory picture. He has clear mentation with no meningismus on exam. Rest of the significant studies include low normal Vit B12 levels. Did travel to [place] where ticks and Lyme is endemic but after his symptoms started. No recent significant illnesses.   Interval Hx   He reports subjective improvement in the tingling in his fingers which is significantly improved and the bottom of his feet which is somewhat improved. Reports that hhas been walking around the floor with a walker.  Feels like pain is also improved today, specifically in his arms.  Vitals   Vitals:   10/05/20 0030 10/05/20 0431 10/05/20 0750 10/05/20 1137  BP: 128/74 133/85 (!) 144/71 135/88  Pulse: (!) 57 63 (!) 57 87  Resp: 16 16 16 18   Temp: 97.9 F (36.6 C) 98 F (36.7 C) 98.1 F (36.7 C) 98.5 F (36.9 C)  TempSrc:      SpO2: 100% 100% 100% 100%  Weight:      Height:         Body mass index is 18.65 kg/m.  Physical Exam   General: Laying comfortably in bed; in no acute distress.  HENT: Normal oropharynx and mucosa. Normal external appearance of ears and nose.  Neck: Supple, no pain or tenderness  CV: No JVD.  No peripheral edema.  Pulmonary: Symmetric Chest rise. Normal respiratory effort.  Abdomen: Soft to touch, non-tender. Ext: No cyanosis, edema, or deformity  Skin: No rash. Normal palpation of skin.   Musculoskeletal: Normal digits and nails by inspection. No clubbing.   Neurologic Examination  Mental status/Cognition: Alert, oriented to self, place, month and year, good attention.  Speech/language: Fluent, comprehension intact, object naming intact, repetition intact. Cranial nerves:   CN II Pupils equal and reactive to light, no VF deficits   CN III,IV,VI EOM intact, no gaze preference or deviation, no nystagmus   CN V normal sensation in V1, V2, and V3 segments bilaterally    CN VII no asymmetry, no nasolabial fold flattening    CN VIII normal hearing to speech    CN IX & X normal palatal elevation, no uvular deviation    CN XI 5/5 head turn and 5/5 shoulder shrug bilaterally    CN XII midline tongue protrusion    Motor:  Muscle bulk: normal, tone normal Mvmt Root Nerve  Muscle Right Left Comments  SA C5/6 Ax Deltoid 4+ 4+   EF C5/6 Mc Biceps 5 5   EE C6/7/8 Rad Triceps 5 5   WF C6/7 Med FCR     WE C7/8 PIN ECU     F Ab C8/T1 U ADM/FDI 4+ 4+   HF L1/2/3 Fem  Illopsoas 4+ 4+   KE L2/3/4 Fem Quad 5 5   DF L4/5 D Peron Tib Ant 4+ 4+   PF S1/2 Tibial Grc/Sol 4+ 4+    Reflexes:  Right Left Comments  Pectoralis      Biceps (C5/6) 2 2   Brachioradialis (C5/6) 2 2    Triceps (C6/7) 2 2    Patellar (L3/4) 0 0    Achilles (S1) 0 0    Hoffman      Plantar down down   Jaw jerk    Sensation:  Light touch Improved from yesterday but still has numbness in his finger tips, numbness on the bottom of his feet.   Pin prick    Temperature    Vibration   Proprioception    Coordination/Complex Motor:  - Finger to Nose intact BL   Labs   Basic Metabolic Panel:  Lab Results  Component Value Date   NA 136 10/05/2020   K 4.1 10/05/2020   CO2 26 10/05/2020   GLUCOSE 95  10/05/2020   BUN 7 10/05/2020   CREATININE 1.03 10/05/2020   CALCIUM 9.3 10/05/2020   GFRNONAA >60 10/05/2020   HbA1c: No results found for: HGBA1C LDL: No results found for: [facility] Urine Drug Screen:     Component Value Date/Time   LABOPIA NONE DETECTED 05/28/2020 0330   COCAINSCRNUR NONE DETECTED 05/28/2020 0330   LABBENZ NONE DETECTED 05/28/2020 0330   AMPHETMU NONE DETECTED 05/28/2020 0330   THCU POSITIVE (A) 05/28/2020 0330   LABBARB NONE DETECTED 05/28/2020 0330    Alcohol Level     Component Value Date/Time   ETH <10 05/28/2020 0236   No results found for: PHENYTOIN, ZONISAMIDE, LAMOTRIGINE, LEVETIRACETA No results found for: PHENYTOIN, PHENOBARB, VALPROATE, CBMZ  Imaging and Diagnostic studies   MRI Brain w an w/o C: 1. No acute intracranial abnormality. 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 Given the location perhaps this is sequelae of remote trauma. Negative MRI appearance of the brain otherwise. No evidence of demyelination.  2. Mild to moderate new paranasal sinus inflammation since 06/29/2023, most pronounced in the left frontal sinus.  MRI C spine w and w/o C: Negative MRI appearance of the cervical spine aside from mild dextroconvex scoliosis. Normal cervical spinal cord. No significant degenerative changes.  MRI T Spine w + w/o C: Normal Thoracic Spine MRI.  Significant labs: AST: 76 ALT: 70 Vit B12: 182. Vit D: 15.10  CSF studies: WBC tube 4: 11 WBC tube 1: 19 Protein: 22 Glucose: 69 Gram Stain: No organisms seen CSF Cxs: pending. HIV Ab: neg RPR: neg. Hepatitis panel: Neg.  Impression   Brian Summers is a 25 y.o. male with no significant PMH who presents with 1 week history of progressive neurological symptoms with now weakness in BL legs with associated pain on deep palpation of his hamstrings and particularly left shoulder along  with BL length dependent numbness in finger tips and bottom of his feet and 1 episode of L eye pain and L eye shut that resolved. He also has absent reflexes on exam for the most part except for intact Brachioradialis BL which is 2. Workup so far with imaging with MRI Brain, C, T spine with and w/o contrast with no acute abnormality. CSF profile with pleocytosis, no elevated protein. Most consistent with an inflammatory picture. He has clear mentation with no meningismus on exam. Rest of the significant studies include  low normal Vit B12 levels. Did travel to [place] where ticks and Lyme is endemic but after his symptoms started. No recent significant illnesses.   Differential is broad and large part of the workup is still pending. Concern for an underlying CNS inflammatory disorder given the elevated WBC count in CSF. Atypical for GBS specially with CSF pleocytosis and normal protein. Other considerations on differential include lyme, tick paralysis(no tick on his body on my evaluation of his hair, armpit, neck and back), porphyria neuropathy(however, unlikely to present with sensory symptoms), CMV/EBV/HIV/Syphilis/Cryoglobulinemia(plausible specially with the noted transaminitis), systemic autoimmune disease with secondary neuro involvement including RF, SLE, Sarcoidosis, Sjogren's. Symptoms could also be related to potential vitamin deficiency specially with the noted low normal B12 levels. Will continue Thiamine replacement until vit B1 levels are back. He denies using any recreational drugs, no exposure to heavy metals.  Overall, his symptoms are spontaneously improving. I think it is reasonable to check on him again tomorrow or day after and hold off on IVIG as long as symptoms are getting better by themselves.  Recommendations  - Vit B1, folate, TSH, SPEP, Heavy metals, Porphyria screen. - ANA with IFA, ACE, Soluble IL2R, DSDNA, ENA. - CMV, EBV, HIV Ab and HIV RNA Quan, Lyme Ab and PCR,  Cryoglobulinemia, Acute Hepatitis panel. - Autoimmune encephalitis panel on serum - Unable to get Oligoclonal Bands, IgG Index, CSF autoimmune panel as out of CSF. - Continue Vit B12 supplementation. ______________________________________________________________________   Thank you for the opportunity to take part in the care of this patient. If you have any further questions, please contact the neurology consultation attending.  Signed,  Erick Blinks [facility] Pager Number [phone]

## 2020-10-06 LAB — ANTI-MICROSOMAL ANTIBODY LIVER / KIDNEY: LKM1 Ab: 1.2 Units (ref 0.0–20.0)

## 2020-10-06 LAB — CBC
HCT: 41.3 % (ref 39.0–52.0)
Hemoglobin: 13.1 g/dL (ref 13.0–17.0)
MCH: 23.7 pg — ABNORMAL LOW (ref 26.0–34.0)
MCHC: 31.7 g/dL (ref 30.0–36.0)
MCV: 74.7 fL — ABNORMAL LOW (ref 80.0–100.0)
Platelets: 262 10*3/uL (ref 150–400)
RBC: 5.53 MIL/uL (ref 4.22–5.81)
RDW: 14.1 % (ref 11.5–15.5)
WBC: 7.2 10*3/uL (ref 4.0–10.5)
nRBC: 0 % (ref 0.0–0.2)

## 2020-10-06 LAB — MISC LABCORP TEST (SEND OUT): Labcorp test code: 505265

## 2020-10-06 LAB — ANTI-DNA ANTIBODY, DOUBLE-STRANDED: ds DNA Ab: <1 IU/mL (ref 0–9)

## 2020-10-06 LAB — HEMOGLOBIN A1C
Hgb A1c MFr Bld: 6 % — ABNORMAL HIGH (ref 4.8–5.6)
Mean Plasma Glucose: 126 mg/dL

## 2020-10-06 LAB — BASIC METABOLIC PANEL
Anion gap: 8 (ref 5–15)
BUN: 13 mg/dL (ref 6–20)
CO2: 26 mmol/L (ref 22–32)
Calcium: 9.2 mg/dL (ref 8.9–10.3)
Chloride: 102 mmol/L (ref 98–111)
Creatinine, Ser: 0.97 mg/dL (ref 0.61–1.24)
GFR, Estimated: >60 mL/min (ref >60)
Glucose, Bld: 109 mg/dL — ABNORMAL HIGH (ref 70–99)
Potassium: 3.8 mmol/L (ref 3.5–5.1)
Sodium: 136 mmol/L (ref 135–145)

## 2020-10-06 LAB — CMV ANTIBODY, IGG (EIA): CMV Ab - IgG: 1.9 U/mL — ABNORMAL HIGH (ref 0.00–0.59)

## 2020-10-06 LAB — ANA W/REFLEX IF POSITIVE: Anti Nuclear Antibody (ANA): NEGATIVE

## 2020-10-06 LAB — MAGNESIUM: Magnesium: 2.1 mg/dL (ref 1.7–2.4)

## 2020-10-06 LAB — HIV-1/HIV-2 QUAL RNA
HIV-1 RNA, Qualitative: NONREACTIVE
HIV-2 RNA, Qualitative: NONREACTIVE

## 2020-10-06 LAB — ANTIEXTRACTABLE NUCLEAR AG
ENA SM Ab Ser-aCnc: <0.2 AI (ref 0.0–0.9)
Ribonucleic Protein: <0.2 AI (ref 0.0–0.9)

## 2020-10-06 LAB — RHEUMATOID FACTOR: Rheumatoid fact SerPl-aCnc: <10 IU/mL (ref <14.0)

## 2020-10-06 LAB — CMV IGM: CMV IgM: 224 AU/mL — ABNORMAL HIGH (ref 0.0–29.9)

## 2020-10-06 LAB — VDRL, CSF: VDRL Quant, CSF: NONREACTIVE

## 2020-10-06 MED ORDER — IMMUNE GLOBULIN (HUMAN) 10 GM/100ML IV SOLN
400.0000 mg/kg | INTRAVENOUS | Status: AC
  Administered 2020-10-06 – 2020-10-10 (×5): 25 g via INTRAVENOUS
  Filled 2020-10-06 (×5): qty 250

## 2020-10-06 NOTE — Progress Notes (Signed)
Neurology Progress Note   S:// Seen and examined this morning. Reports pain in the left shoulder radiating down his back and continue difficulty walking with no improvement in symptoms. No worsening either but does not feel there has been much of improvement in his symptoms.   O:// Current vital signs: BP 132/89 (BP Location: Left Arm)   Pulse 73   Temp (!) 97.4 F (36.3 C) (Axillary)   Resp 16   Ht 5\' 10"  (1.778 m)   Wt 59 kg   SpO2 100%   BMI 18.65 kg/m  Vital signs in last 24 hours: Temp:  [97.4 F (36.3 C)-98.5 F (36.9 C)] 97.4 F (36.3 C) (04/18 0840) Pulse Rate:  [73-87] 73 (04/18 0840) Resp:  [16-20] 16 (04/18 0840) BP: (124-137)/(77-89) 132/89 (04/18 0840) SpO2:  [99 %-100 %] 100 % (04/18 0840) GENERAL: Awake, alert in NAD HEENT: - Normocephalic and atraumatic, dry mm, no LN++, no Thyromegally LUNGS - Clear to auscultation bilaterally with no wheezes CV - S1S2 RRR, no m/r/g, equal pulses bilaterally. ABDOMEN - Soft, nontender, nondistended with normoactive BS Ext: warm, well perfused, intact peripheral pulses, no edema NEURO:  Mental Status: AA&Ox3  Language: speech is clear.  Naming, repetition, fluency, and comprehension intact. Cranial Nerves: PERRL. EOMI, visual fields full, no facial asymmetry, facial sensation intact, hearing intact, tongue/uvula/soft palate midline, normal sternocleidomastoid and trapezius muscle strength. No evidence of tongue atrophy or fibrillations Motor: Left upper extremity proximally is 4+/5, right upper extremity proximally at the shoulder is 5/5.  Distally both upper extremities 5/5. Both lower extremities proximally are 4+/5 with some component of effort dependent weakness-?due to pain-worse on the left.  Distally plantar and dorsiflexion-5/5 bilaterally. Tone: is normal.  He is a lean man with normal bulk for his body habitus Sensation-intact in the upper extremities.  Diminished to all components in a stocking type pattern in the  lower extremities up to mid calf. Coordination: FTN intact bilaterally Gait-very unsteady while standing up from bed.  Requires assistance to get off of the bed.  Able to take couple steps but is very cautious and has staggering gait while taking 2 small cautious steps. DTRs: Absent biceps, triceps, brachioradialis bilaterally.  Absent knee jerks bilaterally.  Absent ankle jerks bilaterally.  Medications  Current Facility-Administered Medications:  .  0.9 %  sodium chloride infusion, 250 mL, Intravenous, PRN, Agbata, Tochukwu, MD .  acetaminophen (TYLENOL) tablet 1,000 mg, 1,000 mg, Oral, TID PRN **OR** [DISCONTINUED] acetaminophen (TYLENOL) suppository 650 mg, 650 mg, Rectal, Q6H PRN, Agbata, Tochukwu, MD .  cyanocobalamin ((VITAMIN B-12)) injection 1,000 mcg, 1,000 mcg, Intramuscular, Once, Ward, Kristen N, DO .  cyclobenzaprine (FLEXERIL) tablet 10 mg, 10 mg, Oral, TID PRN, 11-08-1990, MD, 10 mg at 10/05/20 2346 .  enoxaparin (LOVENOX) injection 40 mg, 40 mg, Subcutaneous, Q24H, Agbata, Tochukwu, MD, 40 mg at 10/05/20 2027 .  ketorolac (TORADOL) 15 MG/ML injection 15 mg, 15 mg, Intravenous, Q8H PRN, 2028, MD .  ondansetron (ZOFRAN) tablet 4 mg, 4 mg, Oral, Q6H PRN **OR** ondansetron (ZOFRAN) injection 4 mg, 4 mg, Intravenous, Q6H PRN, Agbata, Tochukwu, MD .  sodium chloride flush (NS) 0.9 % injection 3 mL, 3 mL, Intravenous, Q12H, Agbata, Tochukwu, MD, 3 mL at 10/05/20 2028 .  sodium chloride flush (NS) 0.9 % injection 3 mL, 3 mL, Intravenous, PRN, Agbata, Tochukwu, MD, 3 mL at 10/05/20 2028 .  vitamin B-12 (CYANOCOBALAMIN) tablet 1,000 mcg, 1,000 mcg, Oral, Daily, Agbata, Tochukwu, MD, 1,000 mcg at 10/05/20 1140  Labs CBC    Component Value Date/Time   WBC 7.2 10/06/2020 0351   RBC 5.53 10/06/2020 0351   HGB 13.1 10/06/2020 0351   HCT 41.3 10/06/2020 0351   PLT 262 10/06/2020 0351   MCV 74.7 (L) 10/06/2020 0351   MCH 23.7 (L) 10/06/2020 0351   MCHC 31.7 10/06/2020 0351   RDW  14.1 10/06/2020 0351   LYMPHSABS 5.8 (H) 10/04/2020 0040   MONOABS 0.9 10/04/2020 0040   EOSABS 0.1 10/04/2020 0040   BASOSABS 0.1 10/04/2020 0040    CMP     Component Value Date/Time   NA 136 10/06/2020 0351   K 3.8 10/06/2020 0351   CL 102 10/06/2020 0351   CO2 26 10/06/2020 0351   GLUCOSE 109 (H) 10/06/2020 0351   BUN 13 10/06/2020 0351   CREATININE 0.97 10/06/2020 0351   CALCIUM 9.2 10/06/2020 0351   PROT 7.5 10/04/2020 0040   ALBUMIN 3.9 10/04/2020 0040   AST 76 (H) 10/04/2020 0040   ALT 70 (H) 10/04/2020 0040   ALKPHOS 54 10/04/2020 0040   BILITOT 0.7 10/04/2020 0040   GFRNONAA >60 10/06/2020 0351   A1c 6.0 B12 182 Vitamin D15.1 AST 76 ALT 70  CSF studies: WBC tube 4: 11 WBC tube 1: 19 Protein: 22 Glucose: 69 Gram Stain: No organisms seen CSF Cxs: pending. HIV Ab: neg RPR: neg. Hepatitis panel: Neg.   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  Given location, probably sequela of remote trauma.  Negative MRI for demyelination.  Mild to moderate paranasal sinus inflammation since December-most pronounced in the left frontal sinus. Cervical and thoracic spine normal.  Assessment:  25 year old man, with no significant past medical history presents with 1 week of progressive neurological symptoms that started with pain in his left thigh, progressing to pain and weakness along with tingling and numbness in both his legs/feet along with tingling and numbness in his fingertips. He was areflexic with the exception of a brachioradialis-my examination today reveals complete areflexia in both upper and lower extremities. Presentation is rather atypical for Guillain-Barr syndrome. Has significant B12 and vitamin D deficiency-could explain  some of these findings. The perplexing part on his CSF analysis is the presence of lymphocytic predominant pleocytosis with normal glucose and protein- not typical for GBS or its variants. Work-up has been initiated to include autoimmune etiologies-work-up is pending. Over the last 2 days, it was felt that his symptoms were spontaneously either improving or stabilized and not worsening but today it seems that he is not showing improvement or stability but is somewhat worse especially in his ability to walk and in subjective complaints of numbness. Atypical Guillain-Barr syndrome is my top differential in spite of a negative CSF work-up-about 10% of the patients with GBS can have normal protein throughout the course of the illness.  The only perplexing part remains the elevated white count with normal protein and glucose-which I do not currently have acute explanation for.  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  I explained to him the treatment options that exist for GBS including IVIG. He would like to wait for his mother  to come in to have this conversation with me in person. Given his young age, and severity of symptoms, I would recommend doing IVIG for 5 days with the hope that that gives him the best chance of recovery without any residual deficits. He will need outpatient neuromuscular neurology follow-up along with EMG/nerve conduction studies. I have communicated my plan with the primary hospitalist Dr. Darlin Priestly over secure chat. I will update the plan once I have a conversation with the patient and his mother as requested by the patient.  -- Milon Dikes, MD Neurologist [facility] Pager: [phone]

## 2020-10-06 NOTE — Progress Notes (Signed)
PROGRESS NOTE    Brian Summers  YQM:[id number] DOB: 1996-01-12 DOA: 10/03/2020 PCP: System, Provider Not In  118A/118A-AA   Assessment & Plan:   Principal Problem:   Weakness Active Problems:   Transaminitis   Brian Summers is a 25 y.o. male with no significant past medical history who presents to the emergency room for evaluation of bilateral lower extremity weakness, numbness and tingling involving his lower extremities and progression to his upper arms, pain in the back of his neck as well as shoulder pain. Symptoms have been ongoing for the past 1 week and have been progressive.  He also complains of pain in his left leg/thigh with radiation to his shin and now left foot with associated numbness making it difficult for him to ambulate.  Patient states that he fell about 3 days prior to presenting in the emergency room due to having an unsteady gait.  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 --MRI is negative for demyelinating lesion --Neuro consulted, has concern for an underlying CNS inflammatory disorder given the elevated WBC count in CSF. --Neuro ordered the following: - Vit B1, folate, TSH, SPEP, Heavy metals, Porphyria screen. - ANA with IFA, ACE, Soluble IL2R, DSDNA, ENA. - CMV, EBV, HIV Ab and HIV RNA Quan, Lyme Ab and PCR, Cryoglobulinemia, Acute Hepatitis panel. - Autoimmune encephalitis panel on serum - Unable to get Oligoclonal Bands, IgG Index, CSF autoimmune panel as out of CSF. Plan: - Continue Vit B12 supplementation. --tylenol, IV toradol, flexeril for pain and muscle ache.  Avoid opioids. --start IVIG today per neuro,  plan for 5 days of treatment --will need outpatient neuromuscular neurology follow-up along with EMG/nerve conduction studies.   DVT prophylaxis: Lovenox SQ Code Status: Full code  Family Communication:  Level of care: Med-Surg Dispo:   The patient is from: home Anticipated d/c is to: home vs SNF Anticipated d/c date is: > 3 days  Patient currently is not medically ready to d/c due to: started on IVIG treatment, currently not able to walk.   Subjective and Interval History:  Pt reported feeling about the same, still can't walk, and still has numbness in his extremities.    Normal oral intake.  Started IVIG treatment per neuro today.   Objective: Vitals:   10/05/20 2040 10/05/20 2343 10/06/20 0414 10/06/20 0840  BP: 127/77 124/78 137/86 132/89  Pulse: 73 79 81 73  Resp: 20 17 16 16   Temp: 98.2 F (36.8 C) 97.9 F (36.6 C) 98 F (36.7 C) (!) 97.4 F (36.3 C)  TempSrc: Oral Oral Oral Axillary  SpO2: 100% 100% 99% 100%  Weight:      Height:        Intake/Output Summary (Last 24 hours) at 10/06/2020 1514 Last data filed at 10/06/2020 1406 Gross per 24 hour  Intake 120 ml  Output 750 ml  Net -630 ml   Filed Weights   10/03/20 2341  Weight: 59 kg    Examination:   Constitutional: NAD, AAOx3 HEENT: conjunctivae and lids normal, EOMI CV: No cyanosis.   RESP: normal respiratory effort, on RA Extremities: No effusions, edema in BLE SKIN: warm, dry Neuro: II - XII grossly intact.   Psych: Normal mood and affect.  Appropriate judgement and reason   Data Reviewed: I have personally reviewed following labs and imaging studies  CBC: Recent Labs  Lab 10/04/20 0040 10/05/20 0353 10/06/20 0351  WBC 8.6 8.0 7.2  NEUTROABS 1.7  --   --   HGB 12.5* 13.3 13.1  HCT 40.0 40.9 41.3  MCV 75.6* 74.2* 74.7*  PLT 246 254 262   Basic Metabolic Panel: Recent Labs  Lab 10/04/20 0040 10/05/20 0353 10/06/20 0351  NA 138 136 136  K 3.5 4.1 3.8  CL 107 102 102  CO2 26  26 26   GLUCOSE 99 95 109*  BUN 8 7 13   CREATININE 0.81 1.03 0.97  CALCIUM 8.8* 9.3 9.2  MG 1.9  --  2.1   GFR: Estimated Creatinine Clearance: 99.7 mL/min (by C-G formula based on SCr of 0.97 mg/dL). Liver Function Tests: Recent Labs  Lab 10/04/20 0040  AST 76*  ALT 70*  ALKPHOS 54  BILITOT 0.7  PROT 7.5  ALBUMIN 3.9   No results for input(s): LIPASE, AMYLASE in the last 168 hours. No results for input(s): AMMONIA in the last 168 hours. Coagulation Profile: No results for input(s): INR, PROTIME in the last 168 hours. Cardiac Enzymes: Recent Labs  Lab 10/04/20 0040  CKTOTAL 486*   BNP (last 3 results) No results for input(s): PROBNP in the last 8760 hours. HbA1C: Recent Labs    10/04/20 1441  HGBA1C 6.0*   CBG: Recent Labs  Lab 10/04/20 0058  GLUCAP 130*   Lipid Profile: No results for input(s): CHOL, HDL, LDLCALC, TRIG, CHOLHDL, LDLDIRECT in the last 72 hours. Thyroid Function Tests: Recent Labs    10/04/20 0040  TSH 1.265   Anemia Panel: Recent Labs    10/04/20 0157  VITAMINB12 182  FOLATE 10.2   Sepsis Labs: No results for input(s): PROCALCITON, LATICACIDVEN in the last 168 hours.  Recent Results (from the past 240 hour(s))  Resp Panel by RT-PCR (Flu A&B, Covid) Nasopharyngeal Swab     Status: None   Collection Time: 10/04/20 12:40 AM   Specimen: Nasopharyngeal Swab; Nasopharyngeal(NP) swabs in vial transport medium  Result Value Ref Range Status   SARS Coronavirus 2 by RT PCR NEGATIVE NEGATIVE Final    Comment: (NOTE) SARS-CoV-2 target nucleic acids are NOT DETECTED.  The SARS-CoV-2 RNA is generally detectable in upper respiratory specimens during the acute phase of infection. The lowest concentration of SARS-CoV-2 viral copies this assay can detect is 138 copies/mL. A negative result does not preclude SARS-Cov-2 infection and should not be used as the sole basis for treatment or other patient management decisions. A negative result may  occur with  improper specimen collection/handling, submission of specimen other than nasopharyngeal swab, presence of viral mutation(s) within the areas targeted by this assay, and inadequate number of viral copies(<138 copies/mL). A negative result must be combined with clinical observations, patient history, and epidemiological information. The expected result is Negative.  Fact Sheet for Patients:  10/06/20  Fact Sheet for Healthcare Providers:  10/06/20  This test is no t yet approved or cleared by the [employer] FDA and  has been authorized for detection and/or diagnosis of SARS-CoV-2 by FDA under an Emergency Use Authorization (EUA). This EUA will remain  in effect (meaning this test can be used) for the duration of the COVID-19 declaration under Section 564(b)(1) of the Act, 21 U.S.C.section 360bbb-3(b)(1), unless the authorization is terminated  or revoked sooner.       Influenza A by PCR NEGATIVE NEGATIVE  Final   Influenza B by PCR NEGATIVE NEGATIVE Final    Comment: (NOTE) The Xpert Xpress SARS-CoV-2/FLU/RSV plus assay is intended as an aid in the diagnosis of influenza from Nasopharyngeal swab specimens and should not be used as a sole basis for treatment. Nasal washings and aspirates are unacceptable for Xpert Xpress SARS-CoV-2/FLU/RSV testing.  Fact Sheet for Patients: [employer]  Fact Sheet for Healthcare Providers: [employer]  This test is not yet approved or cleared by the [place] FDA and has been authorized for detection and/or diagnosis of SARS-CoV-2 by FDA under an Emergency Use Authorization (EUA). This EUA will remain in effect (meaning this test can be used) for the duration of the COVID-19 declaration under Section 564(b)(1) of the Act, 21 U.S.C. section 360bbb-3(b)(1), unless the authorization is terminated  or revoked.  Performed at [facility], [address] [place], [place] [address]   CSF culture w Gram Stain     Status: None (Preliminary result)   Collection Time: 10/04/20  6:37 AM   Specimen: Lumbar Puncture; Cerebrospinal Fluid  Result Value Ref Range Status   Specimen Description   Final    LUMBAR Performed at [facility], [address] [place], [place] [address]    Special Requests   Final    NONE Performed at [facility], [address] [place], [place] [address]    Gram Stain   Final    NO ORGANISMS SEEN WBC SEEN RBC SEEN Performed at [facility], [address], [place], [place] [address]    Culture   Final    NO GROWTH < 24 HOURS Performed at [facility], [address], [place], [place] [address]    Report Status PENDING  Incomplete      Radiology Studies: No results found.   Scheduled Meds: . cyanocobalamin  1,000 mcg Intramuscular Once  . enoxaparin (LOVENOX) injection  40 mg Subcutaneous Q24H  . sodium chloride flush  3 mL Intravenous Q12H  . vitamin B-12  1,000 mcg Oral Daily   Continuous Infusions: . sodium chloride       LOS: 2 days     Darlin Priestly, MD [facility] If 7PM-7AM, please contact night-coverage 10/06/2020, 3:14 PM

## 2020-10-06 NOTE — Evaluation (Signed)
Physical Therapy Evaluation Patient Details Name: Brian Summers MRN: [id number] DOB: 1996-04-24 Today's Date: 10/06/2020   History of Present Illness  Per MD ntoes: Pt is a 25 y.o. male with no significant past medical history who presents to the emergency room for evaluation of bilateral lower extremity weakness, numbness and tingling involving his lower extremities and progression to his upper arms, pain in the back of his neck as well as shoulder pain. 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    Clinical Impression  Pt was pleasant and motivated to participate during the session and put forth very good effort during the session.  Pt presented with significant functional weakness in his BLE's with L minimally weaker than the R.  Pt's BLE's were very tremulous whenever he was in standing position and he required extra effort to come to standing and was only able to amb 10' with significant effort.  Pt's HR increased from the low 100's to the 140's after amb.  Of note, pt's BP taken in standing at 139/111, nsg notified.  Pt is at a very high risk for falls and would not be safe to return to his prior living situation at this time.  Pt will benefit from PT services in a SNF setting upon discharge to safely address deficits listed in patient problem list for decreased caregiver assistance and eventual return to PLOF.      Follow Up Recommendations SNF;Supervision for mobility/OOB    Equipment Recommendations  Rolling walker with 5" wheels;3in1 (PT)    Recommendations for Other Services       Precautions / Restrictions Precautions Precautions: Fall Restrictions Weight Bearing Restrictions: No      Mobility  Bed Mobility Overal bed mobility: Modified Independent             General bed mobility comments: Extra time and effort only    Transfers Overall transfer  level: Needs assistance Equipment used: Rolling walker (2 wheeled) Transfers: Sit to/from Stand Sit to Stand: From elevated surface;Min guard         General transfer comment: Extra time and effort to come to full upright position with BLE's tremulous while in standing  Ambulation/Gait Ambulation/Gait assistance: Min guard Gait Distance (Feet): 10 Feet Assistive device: Rolling walker (2 wheeled) Gait Pattern/deviations: Step-to pattern;Decreased stance time - left;Decreased step length - right Gait velocity: decreased   General Gait Details: Pt able to amb a max of 10 feet with BLE's tremulous throughout with verbal and visual cues/education provided on step-to sequencing to provide UE support to weaker LLE; pt's HR increased from low 100's to the 140's after amb  Stairs            Wheelchair Mobility    Modified Rankin (Stroke Patients Only)       Balance Overall balance assessment: Needs assistance   Sitting balance-Leahy Scale: Good       Standing balance-Leahy Scale: Fair Standing balance comment: Mod lean on the RW for support in standing                             Pertinent Vitals/Pain Pain Assessment: 0-10 Pain Score: 8  Pain Location: General BLE and BUE pain Pain Descriptors / Indicators: Sore;Aching Pain Intervention(s): Monitored during session;Repositioned;Other (comment) (Pt declined offer to contact nursing about pain medication)    Home Living Family/patient expects to be discharged to:: Private  residence Living Arrangements: Non-relatives/Friends Available Help at Discharge: Friend(s);Available 24 hours/day Type of Home: House Home Access: Stairs to enter Entrance Stairs-Rails: Right;Left Entrance Stairs-Number of Steps: 5 steps with wide B rails or 2 steps without rails Home Layout: Two level;Able to live on main level with bedroom/bathroom Home Equipment: None      Prior Function Level of Independence: Independent          Comments: Ind amb community distances without an AD, one fall in the last 6 months that occured recently when attempting to climb over a small wall, Ind with ADLs     Hand Dominance        Extremity/Trunk Assessment   Upper Extremity Assessment Upper Extremity Assessment: RUE deficits/detail;LUE deficits/detail RUE Deficits / Details: Fair grip strength, elbow flex/ext 4-/5, shoulder strength difficult to assess secondary to pain: unable to get to 90 deg of abd or flex actively or passively secondary to shoulder pain LUE Deficits / Details: Fair grip strength, elbow flex/ext 4-/5, shoulder strength difficult to assess secondary to pain: unable to get to 90 deg of abd or flex actively or passively secondary to shoulder pain    Lower Extremity Assessment Lower Extremity Assessment: RLE deficits/detail;LLE deficits/detail RLE Deficits / Details: RLE knee and hip strength grossly 3+ to 4-/5 RLE Sensation: decreased light touch LLE Deficits / Details: LLE knee and hip strength grossly 3+/5 LLE Sensation: decreased light touch       Communication   Communication: No difficulties  Cognition Arousal/Alertness: Awake/alert Behavior During Therapy: WFL for tasks assessed/performed Overall Cognitive Status: Within Functional Limits for tasks assessed                                        General Comments      Exercises Other Exercises Other Exercises: 180 deg turn training with the RW   Assessment/Plan    PT Assessment Patient needs continued PT services  PT Problem List Decreased strength;Decreased activity tolerance;Decreased balance;Decreased mobility;Decreased knowledge of use of DME;Pain       PT Treatment Interventions DME instruction;Gait training;Stair training;Functional mobility training;Therapeutic activities;Therapeutic exercise;Balance training;Patient/family education    PT Goals (Current goals can be found in the Care Plan section)  Acute  Rehab PT Goals Patient Stated Goal: To walk better without pain or a walker PT Goal Formulation: With patient Time For Goal Achievement: 10/19/20 Potential to Achieve Goals: Good    Frequency Min 2X/week   Barriers to discharge Inaccessible home environment      Co-evaluation               AM-PAC PT "6 Clicks" Mobility  Outcome Measure Help needed turning from your back to your side while in a flat bed without using bedrails?: None Help needed moving from lying on your back to sitting on the side of a flat bed without using bedrails?: None Help needed moving to and from a bed to a chair (including a wheelchair)?: A Little Help needed standing up from a chair using your arms (e.g., wheelchair or bedside chair)?: A Little Help needed to walk in hospital room?: A Little Help needed climbing 3-5 steps with a railing? : A Lot 6 Click Score: 19    End of Session Equipment Utilized During Treatment: Gait belt Activity Tolerance: Patient tolerated treatment well Patient left: in bed;with call bell/phone within reach;with bed alarm set;with family/visitor present Nurse Communication: Mobility status  PT Visit Diagnosis: Unsteadiness on feet (R26.81);History of falling (Z91.81);Muscle weakness (generalized) (M62.81);Difficulty in walking, not elsewhere classified (R26.2);Pain Pain - Right/Left:  (General BLE and back pain) Pain - part of body: Leg    Time: 4128-7867 PT Time Calculation (min) (ACUTE ONLY): 44 min   Charges:   PT Evaluation $PT Eval Moderate Complexity: 1 Mod PT Treatments $Gait Training: 8-22 mins        D. Elly Modena PT, DPT 10/06/20, 5:50 PM

## 2020-10-07 LAB — CBC
HCT: 43.7 % (ref 39.0–52.0)
Hemoglobin: 14 g/dL (ref 13.0–17.0)
MCH: 23.7 pg — ABNORMAL LOW (ref 26.0–34.0)
MCHC: 32 g/dL (ref 30.0–36.0)
MCV: 73.9 fL — ABNORMAL LOW (ref 80.0–100.0)
Platelets: 279 10*3/uL (ref 150–400)
RBC: 5.91 MIL/uL — ABNORMAL HIGH (ref 4.22–5.81)
RDW: 14.2 % (ref 11.5–15.5)
WBC: 7.7 10*3/uL (ref 4.0–10.5)
nRBC: 0 % (ref 0.0–0.2)

## 2020-10-07 LAB — BASIC METABOLIC PANEL
Anion gap: 7 (ref 5–15)
BUN: 12 mg/dL (ref 6–20)
CO2: 28 mmol/L (ref 22–32)
Calcium: 9.5 mg/dL (ref 8.9–10.3)
Chloride: 101 mmol/L (ref 98–111)
Creatinine, Ser: 1.03 mg/dL (ref 0.61–1.24)
GFR, Estimated: >60 mL/min (ref >60)
Glucose, Bld: 109 mg/dL — ABNORMAL HIGH (ref 70–99)
Potassium: 4.4 mmol/L (ref 3.5–5.1)
Sodium: 136 mmol/L (ref 135–145)

## 2020-10-07 LAB — EPSTEIN-BARR VIRUS (EBV) ANTIBODY PROFILE
EBV NA IgG: >600 U/mL — ABNORMAL HIGH (ref 0.0–17.9)
EBV VCA IgG: 72.2 U/mL — ABNORMAL HIGH (ref 0.0–17.9)
EBV VCA IgM: 75.1 U/mL — ABNORMAL HIGH (ref 0.0–35.9)

## 2020-10-07 LAB — PROTEIN ELECTROPHORESIS, SERUM
A/G Ratio: 1.1 (ref 0.7–1.7)
Albumin ELP: 3.9 g/dL (ref 2.9–4.4)
Alpha-1-Globulin: 0.3 g/dL (ref 0.0–0.4)
Alpha-2-Globulin: 0.6 g/dL (ref 0.4–1.0)
Beta Globulin: 1 g/dL (ref 0.7–1.3)
Gamma Globulin: 1.8 g/dL (ref 0.4–1.8)
Globulin, Total: 3.7 g/dL (ref 2.2–3.9)
Total Protein ELP: 7.6 g/dL (ref 6.0–8.5)

## 2020-10-07 LAB — MISC LABCORP TEST (SEND OUT): Labcorp test code: 142455

## 2020-10-07 LAB — CMV DNA BY PCR, QUALITATIVE: CMV DNA, Qual PCR: POSITIVE — AB

## 2020-10-07 LAB — MAGNESIUM: Magnesium: 2.1 mg/dL (ref 1.7–2.4)

## 2020-10-07 LAB — EPSTEIN BARR VRS(EBV DNA BY PCR): EBV DNA QN by PCR: NEGATIVE IU/mL

## 2020-10-07 MED ORDER — VALGANCICLOVIR HCL 450 MG PO TABS
900.0000 mg | ORAL_TABLET | Freq: Two times a day (BID) | ORAL | Status: DC
  Administered 2020-10-07 – 2020-10-10 (×6): 900 mg via ORAL
  Filled 2020-10-07 (×8): qty 2

## 2020-10-07 NOTE — Progress Notes (Signed)
Late entry phone call note  Spoke late last evening with patient's mother. Discussed IVIG, risks/benefits. Agreed to proceed. Ordered 5 doses of IVIG  -- Milon Dikes, MD Neurologist [facility] Pager: [phone]

## 2020-10-07 NOTE — Progress Notes (Signed)
PROGRESS NOTE    Brian Summers  UKG:[id number] DOB: 05/18/1996 DOA: 10/03/2020 PCP: System, Provider Not In  118A/118A-AA  Brian Summers is a 25 y.o. male with no significant past medical history who presents to the emergency room for evaluation of bilateral lower extremity weakness, numbness and tingling involving his lower extremities and progression to his upper arms, pain in the back of his neck as well as shoulder pain. Symptoms have been ongoing for the past 1 week and have been progressive.  He also complains of pain in his left leg/thigh with radiation to his shin and now left foot with associated numbness making it difficult for him to ambulate.  Patient states that he fell about 3 days prior to presenting in the emergency room due to having an unsteady gait.  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 ID was consulted today for positive CMV IgG and IgM.  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 --MRI is negative for demyelinating lesion --Neuro consulted, has concern for an underlying CNS inflammatory disorder given the elevated WBC count in CSF. --Neuro ordered the following: - Vit B1, folate, TSH, SPEP, Heavy metals, Porphyria screen. - ANA with IFA, ACE, Soluble IL2R, DSDNA, ENA. -- Autoimmune encephalitis panel on serum-pending -CMV IgG and IgM elevated, HIV negative, acute hepatitis negative-rest of the labs pending - Unable to get Oligoclonal Bands, IgG Index, CSF autoimmune panel as out of CSF. Plan: -  Continue Vit B12 supplementation. --tylenol, IV toradol, flexeril for pain and muscle ache.  Avoid opioids. 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 -ID consult. --will need outpatient neuromuscular neurology follow-up along with EMG/nerve conduction studies.   DVT prophylaxis: Lovenox SQ Code Status: Full code  Family Communication: Discussed with patient and a friend at bedside. Level of care: Med-Surg Dispo:   The patient is from: home Anticipated d/c is to: home vs SNF Anticipated d/c date is: > 3 days  Patient currently is not medically ready to d/c due to: started on IVIG treatment, currently not able to walk.   Subjective and Interval History:  Patient continues to feel some weakness in lower extremities, stating that seems improving but he is feeling worsening of tingling and numbness in his hands.  Objective: Vitals:   10/06/20 2316 10/06/20 2356 10/07/20 0519 10/07/20 0733  BP: 131/83 129/86 127/88 125/87  Pulse: 99 97 89 93  Resp: 18 16 16 16   Temp: 98.6 F (37 C) 98.3 F (36.8 C) (!) 97.4 F (36.3 C) 98.4 F (36.9 C)  TempSrc:   Oral   SpO2: 99% 99% 99% 99%  Weight:      Height:        Intake/Output Summary (Last 24 hours) at 10/07/2020 1305 Last data filed at 10/07/2020 0500 Gross per 24 hour  Intake 610 ml  Output --  Net 610 ml   Filed Weights   10/03/20 2341  Weight: 59 kg    Examination:   General.  Young adult, in no acute distress. Pulmonary.  Lungs clear bilaterally, normal respiratory effort. CV.  Regular rate and rhythm, no JVD, rub or murmur. Abdomen.  Soft, nontender, nondistended, BS positive. CNS.  Alert and oriented x3.  No focal neurologic deficit. Extremities.  No edema, no cyanosis, pulses intact and symmetrical. Psychiatry.  Judgment and insight appears  normal.  Data Reviewed: I have personally reviewed following labs and imaging studies  CBC: Recent Labs  Lab 10/04/20 0040 10/05/20 0353 10/06/20 0351 10/07/20 0501  WBC 8.6 8.0 7.2 7.7  NEUTROABS 1.7  --   --   --   HGB 12.5* 13.3 13.1 14.0  HCT 40.0 40.9 41.3 43.7  MCV 75.6* 74.2* 74.7* 73.9*  PLT 246 254 262 279   Basic Metabolic Panel: Recent Labs  Lab 10/04/20 0040 10/05/20 0353 10/06/20 0351 10/07/20 0501  NA 138 136 136 136  K 3.5 4.1 3.8 4.4  CL 107 102 102 101  CO2 26 26 26 28   GLUCOSE 99 95 109* 109*  BUN 8 7 13 12   CREATININE 0.81 1.03 0.97 1.03  CALCIUM 8.8* 9.3 9.2 9.5  MG 1.9  --  2.1 2.1   GFR: Estimated Creatinine Clearance: 93.9 mL/min (by C-G formula based on SCr of 1.03 mg/dL). Liver Function Tests: Recent Labs  Lab 10/04/20 0040  AST 76*  ALT 70*  ALKPHOS 54  BILITOT 0.7  PROT 7.5  ALBUMIN 3.9   No results for input(s): LIPASE, AMYLASE in the last 168 hours. No results for input(s): AMMONIA in the last 168 hours. Coagulation Profile: No results for input(s): INR, PROTIME in the last 168 hours. Cardiac Enzymes: Recent Labs  Lab 10/04/20 0040  CKTOTAL 486*   BNP (last 3 results) No results for input(s): PROBNP in the last 8760 hours. HbA1C: Recent Labs    10/04/20 1441  HGBA1C 6.0*   CBG: Recent Labs  Lab 10/04/20 0058  GLUCAP 130*   Lipid Profile: No results for input(s): CHOL, HDL, LDLCALC, TRIG, CHOLHDL, LDLDIRECT in the last 72 hours. Thyroid Function Tests: No results for input(s): TSH, T4TOTAL, FREET4, T3FREE, THYROIDAB in the last 72 hours. Anemia Panel: No results for input(s): VITAMINB12, FOLATE, FERRITIN, TIBC, IRON, RETICCTPCT in the last 72 hours. Sepsis Labs: No results for input(s): PROCALCITON, LATICACIDVEN in the last 168 hours.  Recent Results (from the past 240 hour(s))  Resp Panel by RT-PCR (Flu A&B, Covid) Nasopharyngeal Swab     Status: None   Collection Time: 10/04/20 12:40 AM   Specimen:  Nasopharyngeal Swab; Nasopharyngeal(NP) swabs in vial transport medium  Result Value Ref Range Status   SARS Coronavirus 2 by RT PCR NEGATIVE NEGATIVE Final    Comment: (NOTE) SARS-CoV-2 target nucleic acids are NOT DETECTED.  The SARS-CoV-2 RNA is generally detectable in upper respiratory specimens during the acute phase of infection. The lowest concentration of SARS-CoV-2 viral copies this assay can detect is 138 copies/mL. A negative result does not preclude SARS-Cov-2 infection and should not be used as the sole basis for treatment or other patient management decisions. A negative result may occur with  improper specimen collection/handling, submission of specimen other than nasopharyngeal swab, presence of viral mutation(s) within the areas targeted by this assay, and inadequate number of viral copies(<138 copies/mL). A negative result must be combined with clinical observations, patient history, and epidemiological information. The expected result is Negative.  Fact Sheet for Patients:  10/06/20  Fact Sheet for Healthcare Providers:  10/06/20  This test is no t  yet approved or cleared by the [place] and  has been authorized for detection and/or diagnosis of SARS-CoV-2 by FDA under an Emergency Use Authorization (EUA). This EUA will remain  in effect (meaning this test can be used) for the duration of the COVID-19 declaration under Section 564(b)(1) of the Act, 21 U.S.C.section 360bbb-3(b)(1), unless the authorization is terminated  or revoked sooner.       Influenza A by PCR NEGATIVE NEGATIVE Final   Influenza B by PCR NEGATIVE NEGATIVE Final    Comment: (NOTE) The Xpert Xpress SARS-CoV-2/FLU/RSV plus assay is intended as an aid in the diagnosis of influenza from Nasopharyngeal swab specimens and should not be used as a sole basis for treatment. Nasal washings and aspirates are unacceptable for  Xpert Xpress SARS-CoV-2/FLU/RSV testing.  Fact Sheet for Patients: BloggerCourse.com  Fact Sheet for Healthcare Providers: SeriousBroker.it  This test is not yet approved or cleared by the [place] FDA and has been authorized for detection and/or diagnosis of SARS-CoV-2 by FDA under an Emergency Use Authorization (EUA). This EUA will remain in effect (meaning this test can be used) for the duration of the COVID-19 declaration under Section 564(b)(1) of the Act, 21 U.S.C. section 360bbb-3(b)(1), unless the authorization is terminated or revoked.  Performed at [facility], [address] [place], [place] [address]   CSF culture w Gram Stain     Status: None (Preliminary result)   Collection Time: 10/04/20  6:37 AM   Specimen: Lumbar Puncture; Cerebrospinal Fluid  Result Value Ref Range Status   Specimen Description   Final    LUMBAR Performed at [facility], [address] [place], [place] [address]    Special Requests   Final    NONE Performed at [facility], [address] [place], [place] [address]    Gram Stain   Final    NO ORGANISMS SEEN WBC SEEN RBC SEEN Performed at [facility], [address] [place], [place] [address]    Culture   Final    NO GROWTH 2 DAYS Performed at [facility], [address] [place], [place] [address]    Report Status PENDING  Incomplete      Radiology Studies: No results found.   Scheduled Meds: . cyanocobalamin  1,000 mcg Intramuscular Once  . enoxaparin (LOVENOX) injection  40 mg Subcutaneous Q24H  . sodium chloride flush  3 mL Intravenous Q12H  . vitamin B-12  1,000 mcg Oral Daily   Continuous Infusions: . sodium chloride    . Immune Globulin 10% 25 g (10/06/20 1911)     LOS: 3 days     Arnetha Courser, MD [facility] If 7PM-7AM, please contact night-coverage 10/07/2020, 1:05 PM

## 2020-10-07 NOTE — Progress Notes (Signed)
Neurology Progress Note   S:// Seen and examined.  Reports continuing problems with walking but reports that the distal weakness and numbness in the legs is better than yesterday.   O:// Current vital signs: BP 125/87 (BP Location: Right Arm)   Pulse 93   Temp 98.4 F (36.9 C)   Resp 16   Ht 5\' 10"  (1.778 m)   Wt 59 kg   SpO2 99%   BMI 18.65 kg/m  Vital signs in last 24 hours: Temp:  [97.4 F (36.3 C)-99.1 F (37.3 C)] 98.4 F (36.9 C) (04/19 0733) Pulse Rate:  [89-108] 93 (04/19 0733) Resp:  [15-18] 16 (04/19 0733) BP: (117-132)/(83-94) 125/87 (04/19 0733) SpO2:  [99 %-100 %] 99 % (04/19 0733) GENERAL: Awake, alert in NAD HEENT: - Normocephalic and atraumatic, dry mm, no LN++, no Thyromegally LUNGS - Clear to auscultation bilaterally with no wheezes CV - S1S2 RRR, no m/r/g, equal pulses bilaterally. ABDOMEN - Soft, nontender, nondistended with normoactive BS Ext: warm, well perfused, intact peripheral pulses, no edema NEURO:  Mental Status: AA&Ox3  Language: speech is clear.  Naming, repetition, fluency, and comprehension intact. Cranial Nerves: PERRL. EOMI, visual fields full, no facial asymmetry, facial sensation intact, hearing intact, tongue/uvula/soft palate midline, normal sternocleidomastoid and trapezius muscle strength. No evidence of tongue atrophy or fibrillations Motor: Left upper extremity proximally is 4+/5, right upper extremity proximally at the shoulder is 5/5.  Distally both upper extremities 5/5. Both lower extremities proximally are 4+/5 with some component of effort dependent weakness-?due to pain-worse on the left.  Distally plantar and dorsiflexion-5/5 bilaterally. Tone: is normal.  He is a lean man with normal bulk for his body habitus Sensation-intact in the upper extremities.  Diminished to all components in a stocking type pattern in the lower extremities up to mid calf. Coordination: FTN intact bilaterally Gait-deferred DTRs: Absent biceps,  triceps, brachioradialis bilaterally.  Absent knee jerks bilaterally.  Absent ankle jerks bilaterally.   Medications  Current Facility-Administered Medications:  .  0.9 %  sodium chloride infusion, 250 mL, Intravenous, PRN, Agbata, Tochukwu, MD .  acetaminophen (TYLENOL) tablet 1,000 mg, 1,000 mg, Oral, TID PRN **OR** [DISCONTINUED] acetaminophen (TYLENOL) suppository 650 mg, 650 mg, Rectal, Q6H PRN, Agbata, Tochukwu, MD .  cyanocobalamin ((VITAMIN B-12)) injection 1,000 mcg, 1,000 mcg, Intramuscular, Once, Ward, Kristen N, DO .  cyclobenzaprine (FLEXERIL) tablet 10 mg, 10 mg, Oral, TID PRN, 11-08-1992, MD, 10 mg at 10/07/20 0000 .  enoxaparin (LOVENOX) injection 40 mg, 40 mg, Subcutaneous, Q24H, Agbata, Tochukwu, MD, 40 mg at 10/06/20 2238 .  Immune Globulin 10% (PRIVIGEN) IV infusion 25 g, 400 mg/kg, Intravenous, Q24 Hr x 5, 2239, MD, Last Rate: 30 mL/hr at 10/06/20 1911, 25 g at 10/06/20 1911 .  ketorolac (TORADOL) 15 MG/ML injection 15 mg, 15 mg, Intravenous, Q8H PRN, 10/08/20, MD .  ondansetron (ZOFRAN) tablet 4 mg, 4 mg, Oral, Q6H PRN **OR** ondansetron (ZOFRAN) injection 4 mg, 4 mg, Intravenous, Q6H PRN, Agbata, Tochukwu, MD .  sodium chloride flush (NS) 0.9 % injection 3 mL, 3 mL, Intravenous, Q12H, Agbata, Tochukwu, MD, 3 mL at 10/06/20 2239 .  sodium chloride flush (NS) 0.9 % injection 3 mL, 3 mL, Intravenous, PRN, Agbata, Tochukwu, MD, 3 mL at 10/05/20 2028 .  vitamin B-12 (CYANOCOBALAMIN) tablet 1,000 mcg, 1,000 mcg, Oral, Daily, Agbata, Tochukwu, MD, 1,000 mcg at 10/06/20 1106  Labs CBC    Component Value Date/Time   WBC 7.7 10/07/2020 0501   RBC 5.91 (H) 10/07/2020  0501   HGB 14.0 10/07/2020 0501   HCT 43.7 10/07/2020 0501   PLT 279 10/07/2020 0501   MCV 73.9 (L) 10/07/2020 0501   MCH 23.7 (L) 10/07/2020 0501   MCHC 32.0 10/07/2020 0501   RDW 14.2 10/07/2020 0501   LYMPHSABS 5.8 (H) 10/04/2020 0040   MONOABS 0.9 10/04/2020 0040   EOSABS 0.1 10/04/2020 0040    BASOSABS 0.1 10/04/2020 0040    CMP     Component Value Date/Time   NA 136 10/07/2020 0501   K 4.4 10/07/2020 0501   CL 101 10/07/2020 0501   CO2 28 10/07/2020 0501   GLUCOSE 109 (H) 10/07/2020 0501   BUN 12 10/07/2020 0501   CREATININE 1.03 10/07/2020 0501   CALCIUM 9.5 10/07/2020 0501   PROT 7.5 10/04/2020 0040   ALBUMIN 3.9 10/04/2020 0040   AST 76 (H) 10/04/2020 0040   ALT 70 (H) 10/04/2020 0040   ALKPHOS 54 10/04/2020 0040   BILITOT 0.7 10/04/2020 0040   GFRNONAA >60 10/07/2020 0501   A1c 6.0 B12 182 Vitamin D15.1 AST 76 ALT 70  CSF studies: WBC tube 4: 11 WBC tube 1: 19 Protein: 22 Glucose: 69 Gram Stain: No organisms seen CSF Cxs: pending. HIV Ab: neg RPR: neg. Hepatitis panel: Neg.  Overnight - Serum CMV IgG and CMV IgM- positive. CMV antibody IgG 1.90 with normal range 0.0-0.59 CMV antibody IgM 224.0.  Negative is less than 30, equivocal is 30-34.9 and positive is greater than 34.9.   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  Given location, probably sequela of remote trauma.  Negative MRI for demyelination.  Mild to moderate paranasal sinus inflammation since December-most pronounced in the left frontal sinus. Cervical and thoracic spine normal.  Assessment:  25 year old man, with no significant past medical history presents with 1 week of progressive neurological symptoms that started with pain in his left thigh, progressing to pain and weakness along with tingling and numbness in both his legs/feet along with tingling and numbness in his fingertips. He was areflexic with the exception of a brachioradialis-my examination today reveals complete areflexia in both upper and lower extremities. Presentation is rather atypical for  Guillain-Barr syndrome. Has significant B12 and vitamin D deficiency-could explain some of these findings. The perplexing part on his CSF analysis is the presence of lymphocytic predominant pleocytosis with normal glucose and protein- not typical for GBS or its variants. Overnight, CMV IgG and IgM have returned positive.  These are serum studies.  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 Loh is HIV negative Work-up has been initiated to include autoimmune etiologies-work-up is pending. Over the last 2 days, it was felt that his symptoms were spontaneously either improving or stabilized and not worsening but today it seems that he is not showing improvement or stability but is somewhat worse especially in his ability to walk and in subjective complaints of numbness.  Differentials include atypical Guillain-Barr syndrome versus CMV polyradiculoneuropathy  Recommendations: After extensive discussion with him and his mother yesterday, I started him on IVIG.  He has received 1 dose already and second dose is due today. In light of the overnight reported CMV results-I would recommend getting input from infectious diseases regarding the CMV IgG and IgM. I  am okay with today's dose of IVIG but if this test does represent acute CMV infection and the patient does require antiviral treatment, I will hold off on the IVIG, let the treatment for CMV be initiated and reassess the patient and if he continues to not show improvement then consider IVIG at that point. Plan discussed with Dr. Nelson Chimes. -- Milon Dikes, MD Neurologist [facility] Pager: [phone]

## 2020-10-07 NOTE — Consult Note (Signed)
Infectious Disease     Reason for Consult: Positive CMV serology, GBS   Referring Physician: Dr Reesa Chew Date of Admission:  10/03/2020   Principal Problem:   Weakness Active Problems:   Transaminitis   HPI: Brian Summers is a 25 y.o. male withno significant pastmedical history admitted with progressive bilateral lower extremity weakness, numbness and tingling involving his lower extremities and progression to his upper arms, pain in the back of his neck as well as shoulder pain. Symptoms have been ongoing for 1 week and have been progressive. He also complained of pain in his left leg/thigh with radiation to his shin and now left foot with associated numbness making it difficult for him to ambulate. Patient states that he fell about 3 days prior to presenting in the emergency room due to having an unsteady gait. Has had extensive wu since admit and followed with neuro. Has started IVIG.  MRI negative of T and C spine. 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 Mild to mod paranasal sinus inflammation. LP with  CSF with19 wbc 91% lymph. Glucose nml, protein 22. 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 CRP nml HIV neg, autoimmune wu negative.   Denies any other prodromal symptom, no recent URIs, GU or GI illnesses. Last vaccine in Feb was covid booster.      History reviewed. No pertinent past medical history. Past Surgical History:  Procedure Laterality Date  . KNEE SURGERY Bilateral    Social History   Tobacco Use  . Smoking status: Never Smoker  . Smokeless tobacco: Never Used  Substance Use Topics  . Alcohol use: Never  . Drug use: Never   History reviewed. No pertinent family history.  Allergies: No Known Allergies  Current antibiotics: Antibiotics Given (last 72 hours)    None      MEDICATIONS: . cyanocobalamin  1,000 mcg Intramuscular Once  . enoxaparin (LOVENOX) injection  40 mg Subcutaneous  Q24H  . sodium chloride flush  3 mL Intravenous Q12H  . vitamin B-12  1,000 mcg Oral Daily    Review of Systems - 11 systems reviewed and negative per HPI   OBJECTIVE: Temp:  [97.4 F (36.3 C)-99.1 F (37.3 C)] 98.4 F (36.9 C) (04/19 0733) Pulse Rate:  [89-108] 93 (04/19 0733) Resp:  [15-18] 16 (04/19 0733) BP: (117-132)/(83-94) 125/87 (04/19 0733) SpO2:  [99 %-100 %] 99 % (04/19 0733) Physical Exam  Constitutional: He is oriented to person, place, and time. He appears well-developed and well-nourished. No distress.  HENT:  Mouth/Throat: Oropharynx is clear and moist. No oropharyngeal exudate.  Cardiovascular: Normal rate, regular rhythm and normal heart sounds. Exam reveals no gallop and no friction rub.  No murmur heard.  Pulmonary/Chest: Effort normal and breath sounds normal. No respiratory distress. He has no wheezes.  Abdominal: Soft. Bowel sounds are normal. He exhibits no distension. There is no tenderness.  Lymphadenopathy:  He has no cervical adenopathy.  Neurological: He is alert and oriented to person, place, and time. Bil LE weakness 4/5 . Absent LE reflexs Skin: Skin is warm and dry. No rash noted. No erythema.  Psychiatric: He has a normal mood and affect. His behavior is normal.     LABS: Results for orders placed or performed during the hospital encounter of 10/03/20 (from the past 48 hour(s))  Basic metabolic panel     Status: Abnormal   Collection Time: 10/06/20  3:51 AM  Result Value Ref Range   Sodium 136 135 -  145 mmol/L   Potassium 3.8 3.5 - 5.1 mmol/L   Chloride 102 98 - 111 mmol/L   CO2 26 22 - 32 mmol/L   Glucose, Bld 109 (H) 70 - 99 mg/dL    Comment: Glucose reference range applies only to samples taken after fasting for at least 8 hours.   BUN 13 6 - 20 mg/dL   Creatinine, Ser 0.97 0.61 - 1.24 mg/dL   Calcium 9.2 8.9 - 10.3 mg/dL   GFR, Estimated >60 >60 mL/min    Comment: (NOTE) Calculated using the CKD-EPI Creatinine Equation (2021)     Anion gap 8 5 - 15    Comment: Performed at [facility], [address] [place], [place] [address]  CBC     Status: Abnormal   Collection Time: 10/06/20  3:51 AM  Result Value Ref Range   WBC 7.2 4.0 - 10.5 K/uL   RBC 5.53 4.22 - 5.81 MIL/uL   Hemoglobin 13.1 13.0 - 17.0 g/dL   HCT 41.3 39.0 - 52.0 %   MCV 74.7 (L) 80.0 - 100.0 fL   MCH 23.7 (L) 26.0 - 34.0 pg   MCHC 31.7 30.0 - 36.0 g/dL   RDW 14.1 11.5 - 15.5 %   Platelets 262 150 - 400 K/uL   nRBC 0.0 0.0 - 0.2 %    Comment: Performed at [facility], [address], [place], [place] [address]  Magnesium     Status: None   Collection Time: 10/06/20  3:51 AM  Result Value Ref Range   Magnesium 2.1 1.7 - 2.4 mg/dL    Comment: Performed at [facility], [address] [place], [place] [address]  Basic metabolic panel     Status: Abnormal   Collection Time: 10/07/20  5:01 AM  Result Value Ref Range   Sodium 136 135 - 145 mmol/L   Potassium 4.4 3.5 - 5.1 mmol/L   Chloride 101 98 - 111 mmol/L   CO2 28 22 - 32 mmol/L   Glucose, Bld 109 (H) 70 - 99 mg/dL    Comment: Glucose reference range applies only to samples taken after fasting for at least 8 hours.   BUN 12 6 - 20 mg/dL   Creatinine, Ser 1.03 0.61 - 1.24 mg/dL   Calcium 9.5 8.9 - 10.3 mg/dL   GFR, Estimated >60 >60 mL/min    Comment: (NOTE) Calculated using the CKD-EPI Creatinine Equation (2021)    Anion gap 7 5 - 15    Comment: Performed at [facility], [address] [place], [place] [address]  CBC     Status: Abnormal   Collection Time: 10/07/20  5:01 AM  Result Value Ref Range   WBC 7.7 4.0 - 10.5 K/uL   RBC 5.91 (H) 4.22 - 5.81 MIL/uL   Hemoglobin 14.0 13.0 - 17.0 g/dL   HCT 43.7 39.0 - 52.0 %   MCV 73.9 (L) 80.0 - 100.0 fL   MCH 23.7 (L) 26.0 - 34.0 pg   MCHC 32.0 30.0 - 36.0 g/dL   RDW 14.2 11.5 - 15.5 %   Platelets 279 150 - 400 K/uL   nRBC 0.0 0.0 - 0.2 %    Comment: Performed at [facility], [address], [place], [place] [address]  Magnesium     Status: None   Collection Time: 10/07/20  5:01 AM  Result Value Ref Range   Magnesium 2.1 1.7 - 2.4 mg/dL    Comment: Performed at [facility], [place], [place] [address]   No components found for: ESR, C REACTIVE PROTEIN MICRO: Recent Results (from the past 720 hour(s))  Resp Panel by RT-PCR (Flu A&B, Covid) Nasopharyngeal Swab     Status: None   Collection Time: 10/04/20 12:40 AM   Specimen: Nasopharyngeal Swab; Nasopharyngeal(NP) swabs in vial transport medium  Result Value Ref Range Status   SARS Coronavirus 2 by RT PCR NEGATIVE NEGATIVE Final    Comment: (NOTE) SARS-CoV-2 target nucleic acids are NOT DETECTED.  The SARS-CoV-2 RNA is generally detectable in upper respiratory specimens during the acute phase of infection. The lowest concentration of SARS-CoV-2 viral copies this assay can detect is 138 copies/mL. A negative result does not preclude SARS-Cov-2 infection and should not be used as the sole basis for treatment or other patient management decisions. A negative result may occur with  improper specimen collection/handling, submission of specimen other than nasopharyngeal swab, presence of viral mutation(s) within the areas targeted by this assay, and inadequate number of viral copies(<138 copies/mL). A negative result must be combined with clinical observations, patient history, and epidemiological information. The expected result is Negative.  Fact Sheet for Patients:  EntrepreneurPulse.com.au  Fact Sheet for Healthcare Providers:  IncredibleEmployment.be  This test is no t yet approved or cleared by the [place] FDA and  has been authorized for detection and/or diagnosis of SARS-CoV-2 by FDA under an Emergency Use Authorization (EUA). This EUA will remain  in effect (meaning this test can be used) for the duration of the COVID-19 declaration  under Section 564(b)(1) of the Act, 21 U.S.C.section 360bbb-3(b)(1), unless the authorization is terminated  or revoked sooner.       Influenza A by PCR NEGATIVE NEGATIVE Final   Influenza B by PCR NEGATIVE NEGATIVE Final    Comment: (NOTE) The Xpert Xpress SARS-CoV-2/FLU/RSV plus assay is intended as an aid in the diagnosis of influenza from Nasopharyngeal swab specimens and should not be used as a sole basis for treatment. Nasal washings and aspirates are unacceptable for Xpert Xpress SARS-CoV-2/FLU/RSV testing.  Fact Sheet for Patients: EntrepreneurPulse.com.au  Fact Sheet for Healthcare Providers: IncredibleEmployment.be  This test is not yet approved or cleared by the [place] FDA and has been authorized for detection and/or diagnosis of SARS-CoV-2 by FDA under an Emergency Use Authorization (EUA). This EUA will remain in effect (meaning this test can be used) for the duration of the COVID-19 declaration under Section 564(b)(1) of the Act, 21 U.S.C. section 360bbb-3(b)(1), unless the authorization is terminated or revoked.  Performed at [facility], [address] [place], [place] [address]   CSF culture w Gram Stain     Status: None (Preliminary result)   Collection Time: 10/04/20  6:37 AM   Specimen: Lumbar Puncture; Cerebrospinal Fluid  Result Value Ref Range Status   Specimen Description   Final    LUMBAR Performed at [facility], [address], [place], [place] [address]    Special Requests   Final    NONE Performed at [facility], [address] [place], [place] [address]    Gram Stain   Final    NO ORGANISMS SEEN WBC SEEN RBC SEEN Performed at [facility], [address], [place], [place] [address]    Culture   Final    NO GROWTH 2 DAYS Performed at [facility], [place] [address], [place], [place] [address]    Report Status PENDING  Incomplete  IMAGING: MR  Brain W and Wo Contrast  Result Date: 10/04/2020 CLINICAL DATA:  25 year old male with 1 week of bilateral leg weakness and numbness, symptom progression to upper extremities. EXAM: MRI HEAD WITHOUT AND WITH CONTRAST TECHNIQUE: Multiplanar, multiecho pulse sequences of the brain and surrounding structures were obtained without and with intravenous contrast. CONTRAST:  7.79mL GADAVIST GADOBUTROL 1 MMOL/ML IV SOLN COMPARISON:  Head CT 05/28/2020. FINDINGS: Mild susceptibility artifact along the anterior vertex of unclear etiology. Brain: Focal cortical encephalomalacia (series 17, image 109) or less likely congenital sulcal variant along the anterior left superior frontal gyrus. T2 appearance also favors chronic encephalomalacia (series 18, image 23), and this is stable from the 06/13/23 CT. No superimposed restricted diffusion to suggest acute infarction. No midline shift, mass effect, evidence of mass lesion, ventriculomegaly, extra-axial collection or acute intracranial hemorrhage. Cervicomedullary junction and pituitary are within normal limits. Pearline Cables and white matter signal appears normal outside of the anterior left superior frontal gyrus. No chronic cerebral blood products are evident. No cerebral white matter lesions. Deep gray matter nuclei, brainstem and cerebellum appear normal. No abnormal enhancement identified. No dural thickening. There is a small developmental venous anomaly in proximity to the abnormal anterior left frontal lobe (series 19, image 105), normal variant. Vascular: Major intracranial vascular flow voids are preserved. The major dural venous sinuses are enhancing and appear to be patent. Skull and upper cervical spine: Normal visible cervical spine. Visualized bone marrow signal is within normal limits. Sinuses/Orbits: Hyperplastic paranasal sinuses. Heterogeneous T1/FLAIR/T2 material now in the lateral portion of the left frontal sinus compatible with inflammatory sinus disease. Mild  associated mucosal thickening at the left frontoethmoidal recess. Mild mucosal thickening also in the alveolar recess of the right maxillary sinus now. The other sinuses remain clear. Orbits appear negative. Other: Mastoids are clear. Visible internal auditory structures appear normal. IMPRESSION: 1. No acute intracranial abnormality. Small area of chronic cortical encephalomalacia in the anterior left superior frontal gyrus, nonspecific and stable since a 06/13/23 CT. Given the location perhaps this is sequelae of remote trauma. Negative MRI appearance of the brain otherwise. No evidence of demyelination. 2. Mild to moderate new paranasal sinus inflammation since 2023-06-13, most pronounced in the left frontal sinus. Electronically Signed   By: Genevie Ann M.D.   On: 10/04/2020 06:07   MR Cervical Spine W or Wo Contrast  Result Date: 10/04/2020 CLINICAL DATA:  25 year old male with 1 week of bilateral leg weakness and numbness, symptom progression to upper extremities. EXAM: MRI CERVICAL SPINE WITHOUT AND WITH CONTRAST TECHNIQUE: Multiplanar and multiecho pulse sequences of the cervical spine, to include the craniocervical junction and cervicothoracic junction, were obtained without and with intravenous contrast. CONTRAST:  7.29mL GADAVIST GADOBUTROL 1 MMOL/ML IV SOLN in conjunction with contrast enhanced imaging of the brain reported separately. COMPARISON:  Brain MRI the same day reported separately. FINDINGS: Alignment: Straightening of cervical lordosis. Subtle dextroconvex cervical scoliosis. No spondylolisthesis. Vertebrae: No marrow edema or evidence of acute osseous abnormality. Visualized bone marrow signal is within normal limits. Cord: Normal. No spinal cord signal abnormality or volume loss identified. No cord expansion. No abnormal intradural enhancement. No dural thickening. Posterior Fossa, vertebral arteries, paraspinal tissues: Cervicomedullary junction is within normal limits. Brain reported  separately today. Preserved major vascular flow voids in the neck. The left vertebral artery appears somewhat dominant. Negative visible neck soft tissues, lung apices. Disc levels: Capacious spinal canal. No significant cervical spine degeneration. No spinal stenosis or convincing neural impingement. IMPRESSION: Negative MRI  appearance of the cervical spine aside from mild dextroconvex scoliosis. Normal cervical spinal cord.  No significant degenerative changes. Electronically Signed   By: Genevie Ann M.D.   On: 10/04/2020 06:11   MR THORACIC SPINE W WO CONTRAST  Result Date: 10/04/2020 CLINICAL DATA:  25 year old male with 1 week of bilateral leg weakness and numbness, symptom progression to upper extremities. EXAM: MRI THORACIC WITHOUT AND WITH CONTRAST TECHNIQUE: Multiplanar and multiecho pulse sequences of the thoracic spine were obtained without and with intravenous contrast. CONTRAST:  7.1mL GADAVIST GADOBUTROL 1 MMOL/ML IV SOLN in conjunction with contrast enhanced imaging of the brain and cervical spine reported separately. COMPARISON:  Brain and cervical spine today reported separately. FINDINGS: Limited cervical spine imaging:  Detailed separately today. Thoracic spine segmentation:  Appears normal. Alignment: Normal thoracic kyphosis. Minimal levoconvex upper thoracic scoliosis. No spondylolisthesis. Vertebrae: No marrow edema or evidence of acute osseous abnormality. Visualized bone marrow signal is within normal limits. Cord: Normal thoracic spinal cord. No cord signal abnormality or volume loss identified. No abnormal intradural enhancement or dural thickening. Capacious thoracic spinal canal throughout. The conus medullaris appears normal at T12-L1. Paraspinal and other soft tissues: Negative. Disc levels: Negative. No thoracic spine degeneration or neural impingement identified. IMPRESSION: Normal Thoracic Spine MRI. Electronically Signed   By: Genevie Ann M.D.   On: 10/04/2020 06:16    Assessment:    Elio Haden is a 25 y.o. male with progressive LE weakness consistent with atypical Guillain Barre syndrome or polyneuropathy. No prodromal sx or recent infections or vaccines.  Did have mild increase lfts on admit.  MRI neg for demyelination. LP lacking cytoalb dissociation with protein only 22. Does have mild lymphocytic pleocytosis.  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  Serological wu neg except positive CMV IGG And IGM.  Other tests including HIV, RPR. CMV PCR qual is +. EBV PCR neg.   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 Unclear if antivirals effective in immunocompetent  patient but would favor starting Valcyte.  Recommendations I asked lab to do CMV PCR on CSF but none remains in lab. Would not necessarily repeat Tap at this point.  Start valcyte 900 mg bid  Check CMV quant viral load. Cont IVIG  Thank you very much for allowing me to participate in the care of this patient. Please call with questions.   Cheral Marker. Ola Spurr, MD

## 2020-10-08 DIAGNOSIS — R200 Anesthesia of skin: Secondary | ICD-10-CM

## 2020-10-08 DIAGNOSIS — E538 Deficiency of other specified B group vitamins: Secondary | ICD-10-CM

## 2020-10-08 LAB — MISC LABCORP TEST (SEND OUT): Labcorp test code: 505265

## 2020-10-08 LAB — BASIC METABOLIC PANEL
Anion gap: 8 (ref 5–15)
BUN: 13 mg/dL (ref 6–20)
CO2: 27 mmol/L (ref 22–32)
Calcium: 9.6 mg/dL (ref 8.9–10.3)
Chloride: 99 mmol/L (ref 98–111)
Creatinine, Ser: 0.99 mg/dL (ref 0.61–1.24)
GFR, Estimated: >60 mL/min (ref >60)
Glucose, Bld: 111 mg/dL — ABNORMAL HIGH (ref 70–99)
Potassium: 4.5 mmol/L (ref 3.5–5.1)
Sodium: 134 mmol/L — ABNORMAL LOW (ref 135–145)

## 2020-10-08 LAB — HEAVY METALS, BLOOD
Arsenic: 2 ug/L (ref 0–9)
Lead: <1 ug/dL (ref 0–4)
Mercury: 1.4 ug/L (ref 0.0–14.9)

## 2020-10-08 LAB — CBC
HCT: 45.2 % (ref 39.0–52.0)
Hemoglobin: 14.4 g/dL (ref 13.0–17.0)
MCH: 23.7 pg — ABNORMAL LOW (ref 26.0–34.0)
MCHC: 31.9 g/dL (ref 30.0–36.0)
MCV: 74.3 fL — ABNORMAL LOW (ref 80.0–100.0)
Platelets: 305 10*3/uL (ref 150–400)
RBC: 6.08 MIL/uL — ABNORMAL HIGH (ref 4.22–5.81)
RDW: 14.2 % (ref 11.5–15.5)
WBC: 8.1 10*3/uL (ref 4.0–10.5)
nRBC: 0 % (ref 0.0–0.2)

## 2020-10-08 LAB — IGG CSF INDEX
Albumin CSF-mCnc: 12 mg/dL (ref 10–45)
Albumin: 4 g/dL — ABNORMAL LOW (ref 4.1–5.2)
CSF IgG Index: 0.6 (ref 0.0–0.7)
IgG (Immunoglobin G), Serum: 1731 mg/dL — ABNORMAL HIGH (ref 603–1613)
IgG, CSF: 3 mg/dL (ref 0.0–10.3)
IgG/Alb Ratio, CSF: 0.25 (ref 0.00–0.25)

## 2020-10-08 LAB — MAGNESIUM: Magnesium: 2.3 mg/dL (ref 1.7–2.4)

## 2020-10-08 LAB — CSF CULTURE W GRAM STAIN
Culture: NO GROWTH
Gram Stain: NONE SEEN

## 2020-10-08 LAB — TSH: TSH: 1.453 u[IU]/mL (ref 0.350–4.500)

## 2020-10-08 MED ORDER — CYANOCOBALAMIN 1000 MCG/ML IJ SOLN
1000.0000 ug | Freq: Every day | INTRAMUSCULAR | Status: DC
  Administered 2020-10-09 – 2020-10-10 (×2): 1000 ug via INTRAMUSCULAR
  Filled 2020-10-08 (×3): qty 1

## 2020-10-08 MED ORDER — CYANOCOBALAMIN 1000 MCG/ML IJ SOLN: 1000.0000 ug | INTRAMUSCULAR | Status: DC

## 2020-10-08 MED ORDER — POLYETHYLENE GLYCOL 3350 17 G PO PACK
17.0000 g | PACK | Freq: Every day | ORAL | Status: DC | PRN
  Administered 2020-10-09 – 2020-10-10 (×2): 17 g via ORAL
  Filled 2020-10-08 (×2): qty 1

## 2020-10-08 NOTE — Progress Notes (Addendum)
Neurology Progress Note   S:// Seen and examined.  Reports feeling nauseous this morning and also complains of dizziness this morning for which he had to lay down in bed.  Was able to walk yesterday some with his friends with improved left foot strength.  Reports numbness in fingertips that is persistent and also includes the palms.  No change in sensory symptoms of the legs. Appreciate ID consultation with Dr. Sampson Goon  O:// Current vital signs: BP 104/61 (BP Location: Right Arm)   Pulse 83   Temp 97.7 F (36.5 C) (Oral)   Resp 18   Ht 5\' 10"  (1.778 m)   Wt 59 kg   SpO2 100%   BMI 18.65 kg/m  Vital signs in last 24 hours: Temp:  [97.6 F (36.4 C)-98.7 F (37.1 C)] 97.7 F (36.5 C) (04/20 0709) Pulse Rate:  [83-110] 83 (04/20 0709) Resp:  [16-18] 18 (04/20 0709) BP: (104-145)/(61-95) 104/61 (04/20 0709) SpO2:  [99 %-100 %] 100 % (04/20 0709) GENERAL: Awake, alert in NAD HEENT: - Normocephalic and atraumatic, dry mm, no LN++, no Thyromegally LUNGS - Clear to auscultation bilaterally with no wheezes CV - S1S2 RRR, no m/r/g, equal pulses bilaterally. ABDOMEN - Soft, nontender, nondistended with normoactive BS Ext: warm, well perfused, intact peripheral pulses, no edema  NEURO:  Mental Status: AA&Ox3  Language: speech is clear.  Naming, repetition, fluency, and comprehension intact. Cranial Nerves: PERRL. EOMI, visual fields full, no facial asymmetry, facial sensation intact, hearing intact, tongue/uvula/soft palate midline, normal sternocleidomastoid and trapezius muscle strength. No evidence of tongue atrophy or fibrillations Motor: Left upper extremity proximally is 4+/5, right upper extremity proximally at the shoulder is 5/5.  Distally both upper extremities 5/5. Both lower extremities proximally are 4+/5 with some component of effort dependent weakness-?due to pain-worse on the left.  Distally plantar and dorsiflexion-5/5 bilaterally on repeated coaching. Tone: is normal.   He is a lean man with normal bulk for his body habitus Sensation-intact in the upper extremities.  Diminished to all components in a stocking type pattern in the lower extremities up to mid calf. Coordination: FTN intact bilaterally Gait-deferred DTRs: Absent biceps, triceps, brachioradialis bilaterally.  Absent knee jerks bilaterally.  Absent ankle jerks bilaterally.  Essentially unchanged exam from yesterday objectively  Medications  Current Facility-Administered Medications:  .  0.9 %  sodium chloride infusion, 250 mL, Intravenous, PRN, Agbata, Tochukwu, MD .  acetaminophen (TYLENOL) tablet 1,000 mg, 1,000 mg, Oral, TID PRN, 1,000 mg at 10/07/20 2227 **OR** [DISCONTINUED] acetaminophen (TYLENOL) suppository 650 mg, 650 mg, Rectal, Q6H PRN, Agbata, Tochukwu, MD .  cyanocobalamin ((VITAMIN B-12)) injection 1,000 mcg, 1,000 mcg, Intramuscular, Once, Ward, Kristen N, DO .  cyclobenzaprine (FLEXERIL) tablet 10 mg, 10 mg, Oral, TID PRN, 11-08-1990, MD, 10 mg at 10/07/20 2228 .  enoxaparin (LOVENOX) injection 40 mg, 40 mg, Subcutaneous, Q24H, Agbata, Tochukwu, MD, 40 mg at 10/07/20 2226 .  Immune Globulin 10% (PRIVIGEN) IV infusion 25 g, 400 mg/kg, Intravenous, Q24 Hr x 5, 2227, MD, Last Rate: 30 mL/hr at 10/06/20 1911, 25 g at 10/07/20 1739 .  ondansetron (ZOFRAN) tablet 4 mg, 4 mg, Oral, Q6H PRN **OR** ondansetron (ZOFRAN) injection 4 mg, 4 mg, Intravenous, Q6H PRN, Agbata, Tochukwu, MD, 4 mg at 10/08/20 0626 .  sodium chloride flush (NS) 0.9 % injection 3 mL, 3 mL, Intravenous, Q12H, Agbata, Tochukwu, MD, 3 mL at 10/07/20 2227 .  sodium chloride flush (NS) 0.9 % injection 3 mL, 3 mL, Intravenous, PRN, Agbata, Tochukwu,  MD, 3 mL at 10/05/20 2028 .  valGANciclovir (VALCYTE) 450 MG tablet TABS 900 mg, 900 mg, Oral, BID, Mick Sell, MD, 900 mg at 10/07/20 2303 .  vitamin B-12 (CYANOCOBALAMIN) tablet 1,000 mcg, 1,000 mcg, Oral, Daily, Agbata, Tochukwu, MD, 1,000 mcg at 10/07/20  1149  Labs CBC    Component Value Date/Time   WBC 8.1 10/08/2020 0349   RBC 6.08 (H) 10/08/2020 0349   HGB 14.4 10/08/2020 0349   HCT 45.2 10/08/2020 0349   PLT 305 10/08/2020 0349   MCV 74.3 (L) 10/08/2020 0349   MCH 23.7 (L) 10/08/2020 0349   MCHC 31.9 10/08/2020 0349   RDW 14.2 10/08/2020 0349   LYMPHSABS 5.8 (H) 10/04/2020 0040   MONOABS 0.9 10/04/2020 0040   EOSABS 0.1 10/04/2020 0040   BASOSABS 0.1 10/04/2020 0040    CMP     Component Value Date/Time   NA 134 (L) 10/08/2020 0349   K 4.5 10/08/2020 0349   CL 99 10/08/2020 0349   CO2 27 10/08/2020 0349   GLUCOSE 111 (H) 10/08/2020 0349   BUN 13 10/08/2020 0349   CREATININE 0.99 10/08/2020 0349   CALCIUM 9.6 10/08/2020 0349   PROT 7.5 10/04/2020 0040   ALBUMIN 3.9 10/04/2020 0040   AST 76 (H) 10/04/2020 0040   ALT 70 (H) 10/04/2020 0040   ALKPHOS 54 10/04/2020 0040   BILITOT 0.7 10/04/2020 0040   GFRNONAA >60 10/08/2020 0349   A1c 6.0 B12 182 Vitamin D15.1 AST 76 ALT 70  CSF studies: WBC tube 4: 11 WBC tube 1: 19 Protein: 22 Glucose: 69 Gram Stain: No organisms seen CSF Cxs: pending. HIV Ab: neg RPR: neg. Hepatitis panel: Neg.  Overnight - Serum CMV IgG and CMV IgM- positive. CMV antibody IgG 1.90 with normal range 0.0-0.59 CMV antibody IgM 224.0.  Negative is less than 30, equivocal is 30-34.9 and positive is greater than 34.9.   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  Given location, probably sequela of remote trauma.  Negative MRI for demyelination.  Mild to moderate paranasal sinus inflammation since December-most pronounced in the left frontal sinus. Cervical and thoracic spine normal.  Assessment:  25 year old man, with no significant past medical history  presents with 1 week of progressive neurological symptoms that started with pain in his left thigh, progressing to pain and weakness along with tingling and numbness in both his legs/feet along with tingling and numbness in his fingertips. He was areflexic with the exception of a brachioradialis-my examination today reveals complete areflexia in both upper and lower extremities. Presentation is rather atypical for Guillain-Barr syndrome. Has significant B12 and vitamin D deficiency-could explain some of these findings. The perplexing part on his CSF analysis is the presence of lymphocytic predominant pleocytosis with normal glucose and protein- not typical for GBS or its variants. CMV IgG and IgM have returned positive.  These are serum studies.  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 Leon is HIV negative.  Has been evaluated by infectious diseases with recommendations for antibiotic treatment for CMV although efficacy unclear and immune competent patients.  Differentials continue to include atypical Guillain-Barr syndrome versus CMV polyradiculoneuropathy (perplexing CSF findings of lymphocytic predominant pleocytosis with normal protein levels and normal glucose)  Recommendations:  Complete 5 days of IVIG-ID okay with that.  I suspect that he would require all 5 doses of IVIG inpatient as he still not okay to be discharged home safely per PT evaluation.  Appreciate ID consultation-continue Anti-viral treatment of CMV as per ID recommendations  PT OT Discussed with Dr. Randol Kern.  I will follow him with you.  -- Milon Dikes, MD Neurologist [facility] Pager: [phone]

## 2020-10-08 NOTE — Progress Notes (Signed)
[facility] CLINIC INFECTIOUS DISEASE PROGRESS NOTE Date of Admission:  10/03/2020     ID: Brian Summers is a 25 y.o. male with GBS and CMV viremia Principal Problem:   Weakness Active Problems:   Transaminitis   Subjective: No fevers, feels like leg weakness a little better. Still with tingling in legs up to mid shin and some in hands. Had episode where felt like h was going to vomit and had diarrhea last night, got up to bedside commode with help, felt weak and dizzy for about 3 minutes. Has not recurred  ROS  Eleven systems are reviewed and negative except per hpi  Medications:  Antibiotics Given (last 72 hours)    Date/Time Action Medication Dose   10/07/20 2303 Given   valGANciclovir (VALCYTE) 450 MG tablet TABS 900 mg 900 mg   10/08/20 1204 Given   valGANciclovir (VALCYTE) 450 MG tablet TABS 900 mg 900 mg     . cyanocobalamin  1,000 mcg Intramuscular Daily   Followed by  . [START ON 10/11/2020] cyanocobalamin  1,000 mcg Intramuscular Weekly  . enoxaparin (LOVENOX) injection  40 mg Subcutaneous Q24H  . sodium chloride flush  3 mL Intravenous Q12H  . valGANciclovir  900 mg Oral BID    Objective: Vital signs in last 24 hours: Temp:  [97.3 F (36.3 C)-98.7 F (37.1 C)] 97.3 F (36.3 C) (04/20 1417) Pulse Rate:  [83-133] 106 (04/20 1417) Resp:  [16-18] 18 (04/20 1417) BP: (104-145)/(61-96) 141/96 (04/20 1417) SpO2:  [98 %-100 %] 99 % (04/20 1417) Physical Exam  Constitutional: He is oriented to person, place, and time. He appears well-developed and well-nourished. No distress.  HENT: anicteric Mouth/Throat: Oropharynx is clear and moist. No oropharyngeal exudate.  Cardiovascular: Normal rate, regular rhythm and normal heart sounds. Exam reveals no gallop and no friction rub.  No murmur heard.  Pulmonary/Chest: Effort normal and breath sounds normal. No respiratory distress. He has no wheezes.  Abdominal: Soft. Bowel sounds are normal. He exhibits no distension. There is  no tenderness.  Lymphadenopathy: He has no cervical adenopathy.  Neurological: He is alert and oriented to person, place, and time. Diminished reflexes bil LE Skin: Skin is warm and dry. No rash noted. No erythema.  Psychiatric: He has a normal mood and affect. His behavior is normal.     Lab Results Recent Labs    10/07/20 0501 10/08/20 0349  WBC 7.7 8.1  HGB 14.0 14.4  HCT 43.7 45.2  NA 136 134*  K 4.4 4.5  CL 101 99  CO2 28 27  BUN 12 13  CREATININE 1.03 0.99    Microbiology: Results for orders placed or performed during the hospital encounter of 10/03/20  Resp Panel by RT-PCR (Flu A&B, Covid) Nasopharyngeal Swab     Status: None   Collection Time: 10/04/20 12:40 AM   Specimen: Nasopharyngeal Swab; Nasopharyngeal(NP) swabs in vial transport medium  Result Value Ref Range Status   SARS Coronavirus 2 by RT PCR NEGATIVE NEGATIVE Final    Comment: (NOTE) SARS-CoV-2 target nucleic acids are NOT DETECTED.  The SARS-CoV-2 RNA is generally detectable in upper respiratory specimens during the acute phase of infection. The lowest concentration of SARS-CoV-2 viral copies this assay can detect is 138 copies/mL. A negative result does not preclude SARS-Cov-2 infection and should not be used as the sole basis for treatment or other patient management decisions. A negative result may occur with  improper specimen collection/handling, submission of specimen other than nasopharyngeal swab, presence of viral mutation(s)  within the areas targeted by this assay, and inadequate number of viral copies(<138 copies/mL). A negative result must be combined with clinical observations, patient history, and epidemiological information. The expected result is Negative.  Fact Sheet for Patients:  BloggerCourse.com  Fact Sheet for Healthcare Providers:  SeriousBroker.it  This test is no t yet approved or cleared by the [place] FDA and   has been authorized for detection and/or diagnosis of SARS-CoV-2 by FDA under an Emergency Use Authorization (EUA). This EUA will remain  in effect (meaning this test can be used) for the duration of the COVID-19 declaration under Section 564(b)(1) of the Act, 21 U.S.C.section 360bbb-3(b)(1), unless the authorization is terminated  or revoked sooner.       Influenza A by PCR NEGATIVE NEGATIVE Final   Influenza B by PCR NEGATIVE NEGATIVE Final    Comment: (NOTE) The Xpert Xpress SARS-CoV-2/FLU/RSV plus assay is intended as an aid in the diagnosis of influenza from Nasopharyngeal swab specimens and should not be used as a sole basis for treatment. Nasal washings and aspirates are unacceptable for Xpert Xpress SARS-CoV-2/FLU/RSV testing.  Fact Sheet for Patients: BloggerCourse.com  Fact Sheet for Healthcare Providers: SeriousBroker.it  This test is not yet approved or cleared by the [place] FDA and has been authorized for detection and/or diagnosis of SARS-CoV-2 by FDA under an Emergency Use Authorization (EUA). This EUA will remain in effect (meaning this test can be used) for the duration of the COVID-19 declaration under Section 564(b)(1) of the Act, 21 U.S.C. section 360bbb-3(b)(1), unless the authorization is terminated or revoked.  Performed at [facility], [address] [place], [place] [address]   CSF culture w Gram Stain     Status: None   Collection Time: 10/04/20  6:37 AM   Specimen: Lumbar Puncture; Cerebrospinal Fluid  Result Value Ref Range Status   Specimen Description   Final    LUMBAR Performed at [facility], [address] [place], [place] [address]    Special Requests   Final    NONE Performed at [facility], [address] [place], [place] [address]    Gram Stain   Final    NO ORGANISMS SEEN WBC SEEN RBC SEEN Performed at [facility], [address] [place], [place] [address]    Culture   Final    NO GROWTH 3 DAYS Performed at [facility], [address] [place], [place] [address]    Report Status 10/08/2020 FINAL  Final    Studies/Results: No results found.  Assessment/Plan: Brian Summers is a 25 y.o. male with progressive LE weakness consistent with atypical Guillain Barre syndrome or polyneuropathy. No prodromal sx or recent infections or vaccines.  Did have mild increase lfts on admit.  MRI neg for demyelination. LP lacking cytoalb dissociation with protein only 22. Does have mild lymphocytic pleocytosis.  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  Serological wu neg except positive CMV IGG And IGM.  Other tests including HIV, RPR. CMV PCR qual is +. EBV PCR neg.   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 Unclear if antivirals effective in immunocompetent  patient but would favor starting Valcyte. I asked lab to do CMV PCR on CSF but none remains in lab. 4/20- episode last night of Nausea but no vomiting, felt weak and dizzy. Has not recurred Recommendations Cont 10 days valcyte 900 mg bid  Check CMV quant viral load. Cont  IVIG  Thank you very much for the consult. Will follow with you.  Mick Sell   10/08/2020, 3:26 PM

## 2020-10-08 NOTE — Progress Notes (Signed)
PROGRESS NOTE    Brian Summers  JIR:[id number] DOB: 1996/03/08 DOA: 10/03/2020 PCP: System, Provider Not In  118A/118A-AA  Brief narrative:  Brian Summers is a 25 y.o. male with no significant past medical history who presents to the emergency room for evaluation of bilateral lower extremity weakness, numbness and tingling involving his lower extremities and progression to his upper arms, pain in the back of his neck as well as shoulder pain. Symptoms have been ongoing for the past 1 week and have been progressive.  He also complains of pain in his left leg/thigh with radiation to his shin and now left foot with associated numbness making it difficult for him to ambulate.  Patient states that he fell about 3 days prior to presenting in the emergency room due to having an unsteady gait.  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 ID was consulted today for positive CMV IgG and IgM.  Subjective and Interval History:  No significant change in lower extremity sensation and weakness, he reports some tingling and numbness in bilateral palms and fingers does report some nausea.    Assessment & Plan:   Principal Problem:   Weakness Active Problems:   Transaminitis   # Weakness and paresthesia in all extremities  # Painful progressive symmetric distal neuropathy -Neurology/ID input greatly appreciated, possible atypical Guillain-Barr syndrome, versus B12 deficiency versus CMV associated polyradiculoneuropathy(HIV-) - Patient presents for evaluation of bilateral lower extremity weakness, numbness and tingling with progression to his upper extremities, and unsteady gait and an episode of fall prior to his admission. - MRI is negative for demyelinating lesion - Neuro consulted, has concern for an underlying CNS inflammatory disorder given the elevated WBC count in CSF. --Neuro ordered the following: - Vit B1, folate, TSH, SPEP, Heavy metals, Porphyria  screen. - ANA with IFA, ACE, Soluble IL2R, DSDNA, ENA. -Autoimmune encephalitis panel on serum-pending -B12 level is low, will replete with IM -CMV IgG and IgM elevated, as well PCR is reactive, HIV negative, ID input greatly appreciated, continue with antiviral treatment for CMV recommendations . - Treatment with IVIG x5 days. --will need outpatient neuromuscular neurology follow-up along with EMG/nerve conduction studies.   Sinus tachycardia -Check EKG, will add TSH to morning labs(normal on 10/04/2020).  DVT prophylaxis: Lovenox SQ Code Status: Full code  Family Communication: Discussed with patient, answered questions in details, none at bedside. Level of care: Med-Surg Dispo:   The patient is from: home Anticipated d/c is to: home vs SNF Anticipated d/c date is: > 3 days  Patient currently is not medically ready to d/c due to: started on IVIG treatment, currently not able to walk.     Objective: Vitals:   10/07/20 2235 10/08/20 0518 10/08/20 0709 10/08/20 1130  BP: (!) 125/91 (!) 145/90 104/61 129/90  Pulse: (!) 109 93 83 (!) 133  Resp: 16 16 18 18   Temp: 98.5 F (36.9 C) 98.7 F (37.1 C) 97.7 F (36.5 C) (!) 97.5 F (36.4 C)  TempSrc:   Oral   SpO2: 99% 100% 100% 98%  Weight:      Height:        Intake/Output Summary (Last 24 hours) at 10/08/2020 1218 Last data filed at 10/08/2020 1100 Gross per 24 hour  Intake 360 ml  Output 1350 ml  Net -990 ml   Filed Weights   10/03/20 2341  Weight: 59 kg    Examination:   Awake Alert, Oriented X 3, No new F.N deficits, Normal affect Symmetrical  Chest wall movement, Good air movement bilaterally, CTAB RRR,No Gallops,Rubs or new Murmurs, No Parasternal Heave +ve B.Sounds, Abd Soft, No tenderness, No rebound - guarding or rigidity. No Cyanosis, Clubbing or edema, No new Rash or bruise    Data Reviewed: I have personally reviewed following labs and imaging studies  CBC: Recent Labs  Lab 10/04/20 0040  10/05/20 0353 10/06/20 0351 10/07/20 0501 10/08/20 0349  WBC 8.6 8.0 7.2 7.7 8.1  NEUTROABS 1.7  --   --   --   --   HGB 12.5* 13.3 13.1 14.0 14.4  HCT 40.0 40.9 41.3 43.7 45.2  MCV 75.6* 74.2* 74.7* 73.9* 74.3*  PLT 246 254 262 279 305   Basic Metabolic Panel: Recent Labs  Lab 10/04/20 0040 10/05/20 0353 10/06/20 0351 10/07/20 0501 10/08/20 0349  NA 138 136 136 136 134*  K 3.5 4.1 3.8 4.4 4.5  CL 107 102 102 101 99  CO2 26 26 26 28 27   GLUCOSE 99 95 109* 109* 111*  BUN 8 7 13 12 13   CREATININE 0.81 1.03 0.97 1.03 0.99  CALCIUM 8.8* 9.3 9.2 9.5 9.6  MG 1.9  --  2.1 2.1 2.3   GFR: Estimated Creatinine Clearance: 97.7 mL/min (by C-G formula based on SCr of 0.99 mg/dL). Liver Function Tests: Recent Labs  Lab 10/04/20 0040  AST 76*  ALT 70*  ALKPHOS 54  BILITOT 0.7  PROT 7.5  ALBUMIN 3.9   No results for input(s): LIPASE, AMYLASE in the last 168 hours. No results for input(s): AMMONIA in the last 168 hours. Coagulation Profile: No results for input(s): INR, PROTIME in the last 168 hours. Cardiac Enzymes: Recent Labs  Lab 10/04/20 0040  CKTOTAL 486*   BNP (last 3 results) No results for input(s): PROBNP in the last 8760 hours. HbA1C: No results for input(s): HGBA1C in the last 72 hours. CBG: Recent Labs  Lab 10/04/20 0058  GLUCAP 130*   Lipid Profile: No results for input(s): CHOL, HDL, LDLCALC, TRIG, CHOLHDL, LDLDIRECT in the last 72 hours. Thyroid Function Tests: No results for input(s): TSH, T4TOTAL, FREET4, T3FREE, THYROIDAB in the last 72 hours. Anemia Panel: No results for input(s): VITAMINB12, FOLATE, FERRITIN, TIBC, IRON, RETICCTPCT in the last 72 hours. Sepsis Labs: No results for input(s): PROCALCITON, LATICACIDVEN in the last 168 hours.  Recent Results (from the past 240 hour(s))  Resp Panel by RT-PCR (Flu A&B, Covid) Nasopharyngeal Swab     Status: None   Collection Time: 10/04/20 12:40 AM   Specimen: Nasopharyngeal Swab;  Nasopharyngeal(NP) swabs in vial transport medium  Result Value Ref Range Status   SARS Coronavirus 2 by RT PCR NEGATIVE NEGATIVE Final    Comment: (NOTE) SARS-CoV-2 target nucleic acids are NOT DETECTED.  The SARS-CoV-2 RNA is generally detectable in upper respiratory specimens during the acute phase of infection. The lowest concentration of SARS-CoV-2 viral copies this assay can detect is 138 copies/mL. A negative result does not preclude SARS-Cov-2 infection and should not be used as the sole basis for treatment or other patient management decisions. A negative result may occur with  improper specimen collection/handling, submission of specimen other than nasopharyngeal swab, presence of viral mutation(s) within the areas targeted by this assay, and inadequate number of viral copies(<138 copies/mL). A negative result must be combined with clinical observations, patient history, and epidemiological information. The expected result is Negative.  Fact Sheet for Patients:  10/06/20  Fact Sheet for Healthcare Providers:  10/06/20  This test is no t yet  approved or cleared by the [place] and  has been authorized for detection and/or diagnosis of SARS-CoV-2 by FDA under an Emergency Use Authorization (EUA). This EUA will remain  in effect (meaning this test can be used) for the duration of the COVID-19 declaration under Section 564(b)(1) of the Act, 21 U.S.C.section 360bbb-3(b)(1), unless the authorization is terminated  or revoked sooner.       Influenza A by PCR NEGATIVE NEGATIVE Final   Influenza B by PCR NEGATIVE NEGATIVE Final    Comment: (NOTE) The Xpert Xpress SARS-CoV-2/FLU/RSV plus assay is intended as an aid in the diagnosis of influenza from Nasopharyngeal swab specimens and should not be used as a sole basis for treatment. Nasal washings and aspirates are unacceptable for Xpert Xpress  SARS-CoV-2/FLU/RSV testing.  Fact Sheet for Patients: BloggerCourse.com  Fact Sheet for Healthcare Providers: SeriousBroker.it  This test is not yet approved or cleared by the [place] FDA and has been authorized for detection and/or diagnosis of SARS-CoV-2 by FDA under an Emergency Use Authorization (EUA). This EUA will remain in effect (meaning this test can be used) for the duration of the COVID-19 declaration under Section 564(b)(1) of the Act, 21 U.S.C. section 360bbb-3(b)(1), unless the authorization is terminated or revoked.  Performed at [facility], [address] [place], [place] [address]   CSF culture w Gram Stain     Status: None   Collection Time: 10/04/20  6:37 AM   Specimen: Lumbar Puncture; Cerebrospinal Fluid  Result Value Ref Range Status   Specimen Description   Final    LUMBAR Performed at [facility], [address] [place], [place] [address]    Special Requests   Final    NONE Performed at [facility], [address] [place], [place] [address]    Gram Stain   Final    NO ORGANISMS SEEN WBC SEEN RBC SEEN Performed at [facility], [address] [place], [place] [address]    Culture   Final    NO GROWTH 3 DAYS Performed at [facility], [address] [place], [place] [address]    Report Status 10/08/2020 FINAL  Final      Radiology Studies: No results found.   Scheduled Meds: . cyanocobalamin  1,000 mcg Intramuscular Once  . enoxaparin (LOVENOX) injection  40 mg Subcutaneous Q24H  . sodium chloride flush  3 mL Intravenous Q12H  . valGANciclovir  900 mg Oral BID  . vitamin B-12  1,000 mcg Oral Daily   Continuous Infusions: . sodium chloride    . Immune Globulin 10% 25 g (10/06/20 1911)     LOS: 4 days     Huey Bienenstock, MD [facility] If 7PM-7AM, please contact night-coverage 10/08/2020, 12:18 PM

## 2020-10-08 NOTE — Progress Notes (Signed)
Physical Therapy Treatment Patient Details Name: Brian Summers MRN: [id number] DOB: 01/03/1996 Today's Date: 10/08/2020    History of Present Illness Per MD ntoes: Pt is a 25 y.o. male with no significant past medical history who presents to the emergency room for evaluation of bilateral lower extremity weakness, numbness and tingling involving his lower extremities and progression to his upper arms, pain in the back of his neck as well as shoulder pain. 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    PT Comments    Pt received in bed, stated tingling is still evident in hands and feet. Pt stood from bed to RW experiencing B calf tightness requiring seated break. Gait distance increased to 15ft with RW and CGA due to high risk of falling. Pt returned to bed for B LE and lower back prolonged stretching.  Pt is very limited with ROM throughout. Pt is not at baseline level of function, remains at a high fall risk, and has a full flight of stairs at home which he could not functionally attempt today.  Continue to recommend SNF placement.   Follow Up Recommendations  SNF;Supervision for mobility/OOB     Equipment Recommendations  Rolling walker with 5" wheels;3in1 (PT)    Recommendations for Other Services       Precautions / Restrictions Precautions Precautions: Fall Restrictions Weight Bearing Restrictions: No    Mobility  Bed Mobility Overal bed mobility: Modified Independent             General bed mobility comments: Extra time and effort only    Transfers Overall transfer level: Needs assistance Equipment used: Rolling walker (2 wheeled) Transfers: Sit to/from Stand Sit to Stand: From elevated surface;Min guard         General transfer comment: Extra time and effort to come to full upright position with BLE's tremulous while in  standing  Ambulation/Gait Ambulation/Gait assistance: Min guard Gait Distance (Feet): 35 Feet Assistive device: Rolling walker (2 wheeled) Gait Pattern/deviations: Step-to pattern;Decreased stance time - left;Decreased step length - right Gait velocity: decreased   General Gait Details:  (Increased reliance on B UE's to support self with walker and prevent falling.  Pt somewhat tremorous with LE steps. Increased fatigue upon completion)   Stairs Stairs: Yes       General stair comments: Unable to attempt at this time   Wheelchair Mobility    Modified Rankin (Stroke Patients Only)       Balance                                            Cognition Arousal/Alertness: Awake/alert                                            Exercises Other Exercises Other Exercises:  (Passive prolonged stretching performed to B heelcords, hamstrings, gluts, lower back.)    General Comments        Pertinent Vitals/Pain Pain Assessment: No/denies pain    Home Living                      Prior Function            PT Goals (current goals  can now be found in the care plan section) Acute Rehab PT Goals Patient Stated Goal: To walk better without pain or a walker    Frequency    Min 2X/week      PT Plan      Co-evaluation              AM-PAC PT "6 Clicks" Mobility   Outcome Measure  Help needed turning from your back to your side while in a flat bed without using bedrails?: None Help needed moving from lying on your back to sitting on the side of a flat bed without using bedrails?: None Help needed moving to and from a bed to a chair (including a wheelchair)?: A Little Help needed standing up from a chair using your arms (e.g., wheelchair or bedside chair)?: A Little Help needed to walk in hospital room?: A Little Help needed climbing 3-5 steps with a railing? : A Lot 6 Click Score: 19    End of Session Equipment  Utilized During Treatment: Gait belt Activity Tolerance: Patient tolerated treatment well Patient left: in bed;with call bell/phone within reach;with bed alarm set Nurse Communication: Mobility status PT Visit Diagnosis: Unsteadiness on feet (R26.81);History of falling (Z91.81);Muscle weakness (generalized) (M62.81);Difficulty in walking, not elsewhere classified (R26.2);Pain     Time: 6256-3893 PT Time Calculation (min) (ACUTE ONLY): 45 min  Charges:  $Gait Training: 8-22 mins $Therapeutic Exercise: 8-22 mins $Therapeutic Activity: 8-22 mins                    Zadie Cleverly, PTA

## 2020-10-08 NOTE — Progress Notes (Signed)
   10/08/20 1130  Assess: MEWS Score  Temp (!) 97.5 F (36.4 C)  BP 129/90  Pulse Rate (!) 133  Resp 18  SpO2 98 %  O2 Device Room Air  Assess: MEWS Score  MEWS Temp 0  MEWS Systolic 0  MEWS Pulse 3  MEWS RR 0  MEWS LOC 0  MEWS Score 3  MEWS Score Color Yellow  Assess: if the MEWS score is Yellow or Red  Were vital signs taken at a resting state? Yes  Focused Assessment No change from prior assessment  Early Detection of Sepsis Score *See Row Information* Low  MEWS guidelines implemented *See Row Information* Yes  Treat  MEWS Interventions Other (Comment);Administered scheduled meds/treatments (MD notified)  Pain Scale 0-10  Take Vital Signs  Increase Vital Sign Frequency  Yellow: Q 2hr X 2 then Q 4hr X 2, if remains yellow, continue Q 4hrs  Escalate  MEWS: Escalate Yellow: discuss with charge nurse/RN and consider discussing with provider and RRT  Notify: Charge Nurse/RN  Name of Charge Nurse/RN Notified Debi Pinkerton  Date Charge Nurse/RN Notified 10/08/20  Notify: Provider  Provider Name/Title Elgergawy  Date Provider Notified 10/08/20  Time Provider Notified 1144  Notification Type Face-to-face  Notification Reason Other (Comment) (change in VS)  Provider response See new orders  Date of Provider Response 10/08/20  Time of Provider Response 1146  Document  Patient Outcome Other (Comment) (will continue to monitor)  Progress note created (see row info) Yes

## 2020-10-09 ENCOUNTER — Inpatient Hospital Stay (HOSPITAL_COMMUNITY)
Admit: 2020-10-09 | Discharge: 2020-10-09 | Disposition: A | Discharge status: Home / Self Care | Payer: Self-pay | Attending: Internal Medicine | Admitting: Internal Medicine

## 2020-10-09 ENCOUNTER — Other Ambulatory Visit: Payer: Self-pay

## 2020-10-09 ENCOUNTER — Other Ambulatory Visit (HOSPITAL_COMMUNITY): Payer: Self-pay

## 2020-10-09 DIAGNOSIS — R531 Weakness: Secondary | ICD-10-CM

## 2020-10-09 LAB — BASIC METABOLIC PANEL
Anion gap: 9 (ref 5–15)
BUN: 20 mg/dL (ref 6–20)
CO2: 25 mmol/L (ref 22–32)
Calcium: 9.9 mg/dL (ref 8.9–10.3)
Chloride: 97 mmol/L — ABNORMAL LOW (ref 98–111)
Creatinine, Ser: 1.02 mg/dL (ref 0.61–1.24)
GFR, Estimated: >60 mL/min (ref >60)
Glucose, Bld: 105 mg/dL — ABNORMAL HIGH (ref 70–99)
Potassium: 4.9 mmol/L (ref 3.5–5.1)
Sodium: 131 mmol/L — ABNORMAL LOW (ref 135–145)

## 2020-10-09 LAB — ANGIOTENSIN CONVERTING ENZYME: Angiotensin-Converting Enzyme: 81 U/L (ref 14–82)

## 2020-10-09 LAB — ECHOCARDIOGRAM COMPLETE
AR max vel: 2.82 cm2
AV Area VTI: 2.43 cm2
AV Area mean vel: 2.76 cm2
AV Mean grad: 2 mmHg
AV Peak grad: 3.8 mmHg
Ao pk vel: 0.98 m/s
Area-P 1/2: 4.57 cm2
Height: 70 in
S' Lateral: 2.52 cm
Weight: 2080 oz

## 2020-10-09 LAB — MAGNESIUM: Magnesium: 2.2 mg/dL (ref 1.7–2.4)

## 2020-10-09 LAB — CBC
HCT: 44.1 % (ref 39.0–52.0)
Hemoglobin: 14 g/dL (ref 13.0–17.0)
MCH: 24 pg — ABNORMAL LOW (ref 26.0–34.0)
MCHC: 31.7 g/dL (ref 30.0–36.0)
MCV: 75.5 fL — ABNORMAL LOW (ref 80.0–100.0)
Platelets: 327 10*3/uL (ref 150–400)
RBC: 5.84 MIL/uL — ABNORMAL HIGH (ref 4.22–5.81)
RDW: 14.7 % (ref 11.5–15.5)
WBC: 9.6 10*3/uL (ref 4.0–10.5)
nRBC: 0 % (ref 0.0–0.2)

## 2020-10-09 LAB — OLIGOCLONAL BANDS, CSF + SERM

## 2020-10-09 MED ORDER — VALGANCICLOVIR HCL 450 MG PO TABS
900.0000 mg | ORAL_TABLET | Freq: Two times a day (BID) | ORAL | 0 refills | Status: DC
  Filled 2020-10-09: qty 32, 8d supply, fill #0

## 2020-10-09 MED ORDER — VALGANCICLOVIR HCL 450 MG PO TABS: 900.0000 mg | ORAL_TABLET | Freq: Two times a day (BID) | ORAL | 0 refills | Status: AC

## 2020-10-09 MED ORDER — POLYETHYLENE GLYCOL 3350 17 G PO PACK
17.0000 g | PACK | Freq: Once | ORAL | Status: DC
  Filled 2020-10-09: qty 1

## 2020-10-09 MED ORDER — SODIUM CHLORIDE 0.9 % IV SOLN: INTRAVENOUS | Status: AC

## 2020-10-09 NOTE — Progress Notes (Signed)
*  PRELIMINARY RESULTS* Echocardiogram 2D Echocardiogram has been performed.  Cristela Blue 10/09/2020, 2:52 PM

## 2020-10-09 NOTE — Progress Notes (Signed)
ID brief note Feels about the same with no progression. No further episodes nausea or LH.  Impression  Brian Deboseis a 24 y.o.malewith progressive LE weakness consistent with atypical Guillain Barre syndrome or polyneuropathy. No prodromal sx or recent infections or vaccines. Did have mild increase lfts on admit. MRI neg for demyelination. LP lacking cytoalb dissociation with protein only 22. Does have mild lymphocytic pleocytosis. 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  Serological wu neg except positive CMV IGG And IGM. Other tests including HIV, RPR. CMV PCR qual is +. EBV PCR neg.   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 Unclear if antivirals effective in immunocompetent patient but would favor starting Valcyte. I asked lab to do CMV PCR on CSF but none remains in lab. 4/20- episode last night of Nausea but no vomiting, felt weak and dizzy. Has not recurred 4/21 no recurrent LH nausea Recommendations Cont 10 days valcyte 900 mg bid  Pending CMV quant viral load. Cont IVIGper neuro. Ok from ID viewpoint for DC on oral valcyte once stable from neuro viewpoint. I will sign off but please call with questions

## 2020-10-09 NOTE — TOC Progression Note (Signed)
MEDICATION RELATED NOTE  Patient Measurements: Height: 5\' 10"  (177.8 cm) Weight: 59 kg (130 lb) IBW/kg (Calculated) : 73  Vital Signs: Temp: 97.5 F (36.4 C) (04/21 1144) Temp Source: Oral (04/21 1144) BP: 118/91 (04/21 1144) Pulse Rate: 107 (04/21 1144) Intake/Output from previous day: 04/20 0701 - 04/21 0700 In: 720 [P.O.:720] Out: 1475 [Urine:1475] Intake/Output from this shift: No intake/output data recorded.  Labs: Recent Labs    10/07/20 0501 10/08/20 0349 10/09/20 0342  WBC 7.7 8.1 9.6  HGB 14.0 14.4 14.0  HCT 43.7 45.2 44.1  PLT 279 305 327  CREATININE 1.03 0.99 1.02  MG 2.1 2.3 2.2   Estimated Creatinine Clearance: 94.8 mL/min (by C-G formula based on SCr of 1.02 mg/dL).   Microbiology: Recent Results (from the past 720 hour(s))  Resp Panel by RT-PCR (Flu A&B, Covid) Nasopharyngeal Swab     Status: None   Collection Time: 10/04/20 12:40 AM   Specimen: Nasopharyngeal Swab; Nasopharyngeal(NP) swabs in vial transport medium  Result Value Ref Range Status   SARS Coronavirus 2 by RT PCR NEGATIVE NEGATIVE Final    Comment: (NOTE) SARS-CoV-2 target nucleic acids are NOT DETECTED.  The SARS-CoV-2 RNA is generally detectable in upper respiratory specimens during the acute phase of infection. The lowest concentration of SARS-CoV-2 viral copies this assay can detect is 138 copies/mL. A negative result does not preclude SARS-Cov-2 infection and should not be used as the sole basis for treatment or other patient management decisions. A negative result may occur with  improper specimen collection/handling, submission of specimen other than nasopharyngeal swab, presence of viral mutation(s) within the areas targeted by this assay, and inadequate number of viral copies(<138 copies/mL). A negative result must be combined with clinical observations, patient history, and epidemiological information. The expected result is Negative.  Fact Sheet for Patients:   10/06/20  Fact Sheet for Healthcare Providers:  BloggerCourse.com  This test is no t yet approved or cleared by the [employer]ious[employer].[place] FDA and  has been authorized for detection and/or diagnosis of SARS-CoV-2 by FDA under an Emergency Use Authorization (EUA). This EUA will remain  in effect (meaning this test can be used) for the duration of the COVID-19 declaration under Section 564(b)(1) of the Act, 21 U.S.C.section 360bbb-3(b)(1), unless the authorization is terminated  or revoked sooner.       Influenza A by PCR NEGATIVE NEGATIVE Final   Influenza B by PCR NEGATIVE NEGATIVE Final    Comment: (NOTE) The Xpert Xpress SARS-CoV-2/FLU/RSV plus assay is intended as an aid in the diagnosis of influenza from Nasopharyngeal swab specimens and should not be used as a sole basis for treatment. Nasal washings and aspirates are unacceptable for Xpert Xpress SARS-CoV-2/FLU/RSV testing.  Fact Sheet for Patients: [place]  Fact Sheet for Healthcare Providers: BloggerCourse.com  This test is not yet approved or cleared by the SeriousBroker.it FDA and has been authorized for detection and/or diagnosis of SARS-CoV-2 by FDA under an Emergency Use Authorization (EUA). This EUA will remain in effect (meaning this test can be used) for the duration of the COVID-19 declaration under Section 564(b)(1) of the Act, 21 U.S.C. section 360bbb-3(b)(1), unless the authorization is terminated or revoked.  Performed at [facility], [address] [place], [place]   CSF culture w Gram Stain     Status: None   Collection Time: 10/04/20  6:37 AM   Specimen: Lumbar Puncture; Cerebrospinal Fluid  Result Value Ref Range Status   Specimen Description   Final  LUMBAR Performed at [facility], [address] [place], [place] [address]    Special Requests   Final     NONE Performed at [facility], [address] [place], [place] [address]    Gram Stain   Final    NO ORGANISMS SEEN WBC SEEN RBC SEEN Performed at [facility], [address] [place], [place] [address]    Culture   Final    NO GROWTH 3 DAYS Performed at [facility], [address], [place], [place] [address]    Report Status 10/08/2020 FINAL  Final    Medical History: History reviewed. No pertinent past medical history.  Medications:  Scheduled:  . cyanocobalamin  1,000 mcg Intramuscular Daily   Followed by  . [START ON 10/11/2020] cyanocobalamin  1,000 mcg Intramuscular Weekly  . enoxaparin (LOVENOX) injection  40 mg Subcutaneous Q24H  . polyethylene glycol  17 g Oral Once  . sodium chloride flush  3 mL Intravenous Q12H  . valGANciclovir  900 mg Oral BID    Assessment / Plan: 24 y.o.malewith progressive LE weakness consistent with atypical Guillain Barre syndrome or polyneuropathy. He has been evaluated by infectious disease who has recommended valganciclovir 900 mg BID for 10 days. Brian Summers is uninsured and Medication Management will not be able to provide the medications due to the cost. I have located a discount drug coupon with a cost of $92.03 - $97.53 through [facility] pharmacy on [address] I spoke to a pharmacist there who has ordered the medication for delivery tomorrow 10/10/20. I spoke to the patient and he is agreeable to this plan.    Lowella Bandy 10/09/2020,2:43 PM

## 2020-10-09 NOTE — TOC Progression Note (Signed)
Transition of Care [facility]) - Progression Note    Patient Details  Name: Brian Summers MRN: [id number] Date of Birth: [birth year]  Transition of Care [facility]) CM/SW Contact  Allayne Butcher, RN Phone Number: 10/09/2020, 3:10 PM  Clinical Narrative:    Referral sent in to [facility] and Medication management.  Application given to patient.  Purple Book of Free and low cost healthcare options also given.     Expected Discharge Plan: Home w Home Health Services Barriers to Discharge: Continued Medical Work up  Expected Discharge Plan and Services Expected Discharge Plan: Home w Home Health Services   Discharge Planning Services: CM Consult,Medication Assistance,Indigent Health Clinic Post Acute Care Choice: Home Health Living arrangements for the past 2 months: Single Family Home                 DME Arranged: 3-N-1,Walker rolling DME Agency: [employer] Date DME Agency Contacted: 10/09/20 Time DME Agency Contacted: 1000 Representative spoke with at DME Agency: Bjorn Loser HH Arranged: PT,OT HH [facility]: [facility] [employer] [facility] Date HH Agency Contacted: 10/09/20 Time HH Agency Contacted: 1200 Representative spoke with at [facility] Agency: [place]   Social Determinants of Health (SDOH) Interventions    Readmission Risk Interventions No flowsheet data found.

## 2020-10-09 NOTE — Progress Notes (Addendum)
Physical Therapy Treatment Patient Details Name: Brian Summers MRN: [id number] DOB: August 11, 1995 Today's Date: 10/09/2020    History of Present Illness Per MD ntoes: Pt is a 25 y.o. male with no significant past medical history who presents to the emergency room for evaluation of bilateral lower extremity weakness, numbness and tingling involving his lower extremities and progression to his upper arms, pain in the back of his neck as well as shoulder pain. 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    PT Comments    Pt much improved since previous treatment. Strength is improving and transfers have become easier requiring supervision for safety.  Pt able to ambulate with RW 59ft with light CG/SBA, and tremor like movements absent from previous day.  Pt educated on step negotiation requiring MinA with single rail to ascend and CGA to descend.  Pt with good understanding of technique and importance of gait belt use with assist.  Due to pt's strength and functional mobility improvements, he appears more appropriate for d/c home with HHPT and intermittent supervision from roommate.  Pt's roommate has already taken home RW and 3in1 commode.   Follow Up Recommendations  Home health PT;Supervision - Intermittent     Equipment Recommendations  Rolling walker with 5" wheels;3in1 (PT)    Recommendations for Other Services       Precautions / Restrictions Precautions Precautions: Fall Restrictions Weight Bearing Restrictions: No    Mobility  Bed Mobility Overal bed mobility: Modified Independent                  Transfers Overall transfer level: Needs assistance Equipment used: Rolling walker (2 wheeled) Transfers: Sit to/from Stand Sit to Stand: Supervision         General transfer comment:  (Pt struggle to transition to standing but able to complete without  assist)  Ambulation/Gait Ambulation/Gait assistance: Min guard Gait Distance (Feet): 60 Feet Assistive device: Rolling walker (2 wheeled) Gait Pattern/deviations: Decreased step length - right;Decreased step length - left     General Gait Details:  (Less unsteady with gait this am)   Stairs Stairs: Yes Stairs assistance: Min assist Stair Management: One rail Right;Forwards Number of Stairs: 1 General stair comments:  (Simulated single step to enter front of home. Pt struggled with ascending due to weakness. Pt will be given a gait belt for assistance upon d/c)   Wheelchair Mobility    Modified Rankin (Stroke Patients Only)       Balance                                            Cognition Arousal/Alertness: Awake/alert Behavior During Therapy: WFL for tasks assessed/performed Overall Cognitive Status: Within Functional Limits for tasks assessed                                        Exercises      General Comments General comments (skin integrity, edema, etc.): Education provided for various step negotiation, safe home environment to prevent falls, and recommendations to sleep on first floor until strength improves      Pertinent Vitals/Pain Pain Assessment: No/denies pain    Home Living  Prior Function            PT Goals (current goals can now be found in the care plan section) Acute Rehab PT Goals Patient Stated Goal:  (To return home) Progress towards PT goals: Progressing toward goals    Frequency    Min 2X/week      PT Plan Discharge plan needs to be updated    Co-evaluation              AM-PAC PT "6 Clicks" Mobility   Outcome Measure  Help needed turning from your back to your side while in a flat bed without using bedrails?: None Help needed moving from lying on your back to sitting on the side of a flat bed without using bedrails?: None Help needed moving to and  from a bed to a chair (including a wheelchair)?: A Little Help needed standing up from a chair using your arms (e.g., wheelchair or bedside chair)?: A Little Help needed to walk in hospital room?: A Little Help needed climbing 3-5 steps with a railing? : A Little 6 Click Score: 20    End of Session Equipment Utilized During Treatment: Gait belt Activity Tolerance: Patient tolerated treatment well Patient left: in bed;with call bell/phone within reach;with bed alarm set Nurse Communication: Mobility status PT Visit Diagnosis: Unsteadiness on feet (R26.81);History of falling (Z91.81);Muscle weakness (generalized) (M62.81);Difficulty in walking, not elsewhere classified (R26.2);Pain     Time: 1120-1205 PT Time Calculation (min) (ACUTE ONLY): 45 min  Charges:  $Gait Training: 23-37 mins $Therapeutic Activity: 8-22 mins                     Zadie Cleverly, PTA  Jannet Askew 10/09/2020, 12:31 PM

## 2020-10-09 NOTE — TOC Initial Note (Signed)
Transition of Care [facility]) - Initial/Assessment Note    Patient Details  Name: Brian Summers MRN: [id number] Date of Birth: 1996/02/03  Transition of Care [facility]) CM/SW Contact:    Shelbie Hutching, RN Phone Number: 10/09/2020, 2:53 PM  Clinical Narrative:                  Patient admitted to the hospital with progressive weakness, thought to be Guillian Barre syndrome or a polyneuropathy.  RNCM met with patient at the bedside.  Patient is from home where he lives with friends.  Significant other Jamar is at the bedside.  PT has recommended SNF for short term rehab but patient says he is 24 and is not going to rehab.  Patient is improving with his mobility and he has great support system including his significant other and mother.  RNCM has provided patient with a Walker and 3 in 1 for home delivered by [employer] to the patient's room today, significant other will go ahead and take it home.  Patient agrees to home health services PT and OT.  Charity referral given to [place] with [facility].  Patient has one more day of IVIG infusion.  Potential plan for discharge over the weekend depending on clinical progression.     Expected Discharge Plan: [place] Barriers to Discharge: Continued Medical Work up   Patient Goals and CMS Choice Patient states their goals for this hospitalization and ongoing recovery are:: to get stronger and get home CMS Medicare.gov Compare Post Acute Care list provided to:: Patient Choice offered to / list presented to : Patient  Expected Discharge Plan and Services Expected Discharge Plan: [place]   Discharge Planning Services: CM Consult,Medication [place] Acute Care Choice: [place] arrangements for the past 2 months: Single Family Home                 DME Arranged: 3-N-1,Walker rolling DME Agency: [employer] Date DME Agency Contacted: 10/09/20 Time DME Agency Contacted:  1000 Representative spoke with at DME Agency: [place]: PT,OT [place] Agency: [facility] [employer] Date [place]: 10/09/20 Time [place]: 73 Representative spoke with at [place]: [place]  Prior Living Arrangements/Services Living arrangements for the past 2 months: [place] with:: Roommate,Friends Patient language and need for interpreter reviewed:: Yes Do you feel safe going back to the place where you live?: Yes      Need for Family Participation in Patient Care: Yes (Comment) (gillian barre syndrome) Care giver support system in place?: Yes (comment) (mother and significant other)   Criminal Activity/Legal Involvement Pertinent to Current Situation/Hospitalization: No - Comment as needed  Activities of Daily Living Home Assistive Devices/Equipment: None ADL Screening (condition at time of admission) Patient's cognitive ability adequate to safely complete daily activities?: Yes Is the patient deaf or have difficulty hearing?: No Does the patient have difficulty seeing, even when wearing glasses/contacts?: No Does the patient have difficulty concentrating, remembering, or making decisions?: No Patient able to express need for assistance with ADLs?: Yes Does the patient have difficulty dressing or bathing?: No Independently performs ADLs?: Yes (appropriate for developmental age) Does the patient have difficulty walking or climbing stairs?: Yes Weakness of Legs: Both Weakness of Arms/Hands: None  Permission Sought/Granted Permission sought to share information with : Case Manager,Family Supports,Other (comment) Permission granted to share information with : Yes, Verbal Permission Granted  Share Information with NAME:  Leafy Ro, Academic librarian granted to share info w AGENCY: home health agency  Permission granted to share info w Relationship: mother and significant other     Emotional  Assessment Appearance:: Appears stated age Attitude/Demeanor/Rapport: Engaged Affect (typically observed): Accepting Orientation: : Oriented to Self,Oriented to Place,Oriented to  Time,Oriented to Situation Alcohol / Substance Use: Not Applicable Psych Involvement: No (comment)  Admission diagnosis:  Numbness [R20.0] Weakness [R53.1] B12 deficiency [E53.8] Patient Active Problem List   Diagnosis Date Noted  . Weakness 10/04/2020  . Transaminitis 10/04/2020   PCP:  System, Provider Not In Pharmacy:   [place] [address] Phone: [phone] Fax: [phone]  [place], [place] [address] Phone: [phone] Fax: [phone]     Social Determinants of Health (SDOH) Interventions    Readmission Risk Interventions No flowsheet data found.

## 2020-10-09 NOTE — Progress Notes (Signed)
PROGRESS NOTE    Brian Summers  HQI:[id number] DOB: [birth year] DOA: 10/03/2020 PCP: System, Provider Not In  118A/118A-AA  Brief narrative:  Brian Summers is a 25 y.o. male with no significant past medical history who presents to the emergency room for evaluation of bilateral lower extremity weakness, numbness and tingling involving his lower extremities and progression to his upper arms, pain in the back of his neck as well as shoulder pain. Symptoms have been ongoing for the past 1 week and have been progressive.  He also complains of pain in his left leg/thigh with radiation to his shin and now left foot with associated numbness making it difficult for him to ambulate.  Patient states that he fell about 3 days prior to presenting in the emergency room due to having an unsteady gait.  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 ID was consulted today for positive CMV IgG and IgM.  Subjective and Interval History:   Patient denies any significant change in tingling and numbness, but reported improvement of his weakness.    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 - MRI is negative for demyelinating lesion - Neuro consulted, has concern for an underlying CNS inflammatory disorder given the elevated WBC count in CSF. --Neuro ordered the following: - Vit B1, folate, TSH, SPEP, Heavy metals, Porphyria screen. - ANA with IFA, ACE, Soluble IL2R, DSDNA,  ENA. -Autoimmune encephalitis panel on serum-pending -B12 level is low, will replete with IM -CMV IgG and IgM elevated, as well PCR is reactive, HIV negative, ID input greatly appreciated, continue with antiviral treatment for CMV recommendations . - Treatment with IVIG x5 days. --will need outpatient neuromuscular neurology follow-up along with EMG/nerve conduction studies.  B12 deficiency -Continue supplements  Sinus tachycardia/hyponatremia -Most likely volume depletion, started on IV fluids, EKG showing sinus tachycardia. -TSH within normal limit - will check  Echo   DVT prophylaxis: Lovenox SQ Code Status: Full code  Family Communication: Discussed with patient, answered questions in details, none at bedside. Level of care: Med-Surg Dispo:   The patient is from: home Anticipated d/c is to: home vs SNF Anticipated d/c date is: > 3 days  Patient currently is not medically ready to d/c due to: started on IVIG treatment, currently not able to walk.     Objective: Vitals:   10/08/20 2324 10/09/20 0336 10/09/20 0750 10/09/20 1144  BP: (!) 134/91 (!) 121/99 (!) 134/97 (!) 118/91  Pulse: (!) 110 (!) 109 98 (!) 107  Resp: 16 18 17 17   Temp: 98.3 F (36.8 C) 97.9 F (36.6 C) 98.1 F (36.7 C) (!) 97.5 F (36.4 C)  TempSrc:  Oral Oral Oral  SpO2: 99% 100% 99% 100%  Weight:      Height:        Intake/Output Summary (Last 24 hours) at 10/09/2020 1252 Last data filed at 10/09/2020 0336 Gross per 24 hour  Intake 720 ml  Output 1225 ml  Net -505 ml   Filed Weights   10/03/20 2341  Weight: 59 kg    Examination:   Awake Alert, Oriented X  3, No new F.N deficits, Normal affect Symmetrical Chest wall movement, Good air movement bilaterally, CTAB RRR,No Gallops,Rubs or new Murmurs, No Parasternal Heave +ve B.Sounds, Abd Soft, No tenderness, No rebound - guarding or rigidity. No Cyanosis, Clubbing or edema, No new Rash or bruise     Data Reviewed: I have personally  reviewed following labs and imaging studies  CBC: Recent Labs  Lab 10/04/20 0040 10/05/20 0353 10/06/20 0351 10/07/20 0501 10/08/20 0349 10/09/20 0342  WBC 8.6 8.0 7.2 7.7 8.1 9.6  NEUTROABS 1.7  --   --   --   --   --   HGB 12.5* 13.3 13.1 14.0 14.4 14.0  HCT 40.0 40.9 41.3 43.7 45.2 44.1  MCV 75.6* 74.2* 74.7* 73.9* 74.3* 75.5*  PLT 246 254 262 279 305 327   Basic Metabolic Panel: Recent Labs  Lab 10/04/20 0040 10/05/20 0353 10/06/20 0351 10/07/20 0501 10/08/20 0349 10/09/20 0342  NA 138 136 136 136 134* 131*  K 3.5 4.1 3.8 4.4 4.5 4.9  CL 107 102 102 101 99 97*  CO2 26 26 26 28 27 25   GLUCOSE 99 95 109* 109* 111* 105*  BUN 8 7 13 12 13 20   CREATININE 0.81 1.03 0.97 1.03 0.99 1.02  CALCIUM 8.8* 9.3 9.2 9.5 9.6 9.9  MG 1.9  --  2.1 2.1 2.3 2.2   GFR: Estimated Creatinine Clearance: 94.8 mL/min (by C-G formula based on SCr of 1.02 mg/dL). Liver Function Tests: Recent Labs  Lab 10/04/20 0040 10/04/20 0637  AST 76*  --   ALT 70*  --   ALKPHOS 54  --   BILITOT 0.7  --   PROT 7.5  --   ALBUMIN 3.9 4.0*   No results for input(s): LIPASE, AMYLASE in the last 168 hours. No results for input(s): AMMONIA in the last 168 hours. Coagulation Profile: No results for input(s): INR, PROTIME in the last 168 hours. Cardiac Enzymes: Recent Labs  Lab 10/04/20 0040  CKTOTAL 486*   BNP (last 3 results) No results for input(s): PROBNP in the last 8760 hours. HbA1C: No results for input(s): HGBA1C in the last 72 hours. CBG: Recent Labs  Lab 10/04/20 0058  GLUCAP 130*   Lipid Profile: No results for input(s): CHOL, HDL, LDLCALC, TRIG, CHOLHDL, LDLDIRECT in the last 72 hours. Thyroid Function Tests: Recent Labs    10/08/20 0349  TSH 1.453   Anemia Panel: No results for input(s): VITAMINB12, FOLATE, FERRITIN, TIBC, IRON, RETICCTPCT in the last 72 hours. Sepsis Labs: No results for input(s): PROCALCITON, LATICACIDVEN in the last 168 hours.  Recent Results  (from the past 240 hour(s))  Resp Panel by RT-PCR (Flu A&B, Covid) Nasopharyngeal Swab     Status: None   Collection Time: 10/04/20 12:40 AM   Specimen: Nasopharyngeal Swab; Nasopharyngeal(NP) swabs in vial transport medium  Result Value Ref Range Status   SARS Coronavirus 2 by RT PCR NEGATIVE NEGATIVE Final    Comment: (NOTE) SARS-CoV-2 target nucleic acids are NOT DETECTED.  The SARS-CoV-2 RNA is generally detectable in upper respiratory specimens during the acute phase of infection. The lowest concentration of SARS-CoV-2 viral copies this assay can detect is 138 copies/mL. A negative result does not preclude SARS-Cov-2 infection and should not be used as the sole basis for treatment or other patient management decisions. A negative result may occur with  improper specimen collection/handling, submission of specimen other than nasopharyngeal swab, presence of viral mutation(s) within the areas targeted by this assay, and inadequate  number of viral copies(<138 copies/mL). A negative result must be combined with clinical observations, patient history, and epidemiological information. The expected result is Negative.  Fact Sheet for Patients:  BloggerCourse.com  Fact Sheet for Healthcare Providers:  SeriousBroker.it  This test is no t yet approved or cleared by the [place] FDA and  has been authorized for detection and/or diagnosis of SARS-CoV-2 by FDA under an Emergency Use Authorization (EUA). This EUA will remain  in effect (meaning this test can be used) for the duration of the COVID-19 declaration under Section 564(b)(1) of the Act, 21 U.S.C.section 360bbb-3(b)(1), unless the authorization is terminated  or revoked sooner.       Influenza A by PCR NEGATIVE NEGATIVE Final   Influenza B by PCR NEGATIVE NEGATIVE Final    Comment: (NOTE) The Xpert Xpress SARS-CoV-2/FLU/RSV plus assay is intended as an aid in the  diagnosis of influenza from Nasopharyngeal swab specimens and should not be used as a sole basis for treatment. Nasal washings and aspirates are unacceptable for Xpert Xpress SARS-CoV-2/FLU/RSV testing.  Fact Sheet for Patients: BloggerCourse.com  Fact Sheet for Healthcare Providers: SeriousBroker.it  This test is not yet approved or cleared by the [place] FDA and has been authorized for detection and/or diagnosis of SARS-CoV-2 by FDA under an Emergency Use Authorization (EUA). This EUA will remain in effect (meaning this test can be used) for the duration of the COVID-19 declaration under Section 564(b)(1) of the Act, 21 U.S.C. section 360bbb-3(b)(1), unless the authorization is terminated or revoked.  Performed at [facility], [address] [place], [place] [address]   CSF culture w Gram Stain     Status: None   Collection Time: 10/04/20  6:37 AM   Specimen: Lumbar Puncture; Cerebrospinal Fluid  Result Value Ref Range Status   Specimen Description   Final    LUMBAR Performed at [facility], [address], [place], [place] [address]    Special Requests   Final    NONE Performed at [facility], [address] [place], [place] [address]    Gram Stain   Final    NO ORGANISMS SEEN WBC SEEN RBC SEEN Performed at [facility], [address] [place], [place] [address]    Culture   Final    NO GROWTH 3 DAYS Performed at [facility], [address] [place], [place] [address]    Report Status 10/08/2020 FINAL  Final      Radiology Studies: No results found.   Scheduled Meds: . cyanocobalamin  1,000 mcg Intramuscular Daily   Followed by  . [START ON 10/11/2020] cyanocobalamin  1,000 mcg Intramuscular Weekly  . enoxaparin (LOVENOX) injection  40 mg Subcutaneous Q24H  . polyethylene glycol  17 g Oral Once  . sodium chloride flush  3 mL Intravenous Q12H  .  valGANciclovir  900 mg Oral BID   Continuous Infusions: . sodium chloride    . sodium chloride 100 mL/hr at 10/09/20 0827  . Immune Globulin 10% 25 g (10/06/20 1911)     LOS: 5 days     Huey Bienenstock, MD [facility] If 7PM-7AM, please contact night-coverage 10/09/2020, 12:52 PM

## 2020-10-10 LAB — LYME, WESTERN BLOT, SERUM (REFLEXED)
IgG P18 Ab.: ABSENT
IgG P23 Ab.: ABSENT
IgG P28 Ab.: ABSENT
IgG P30 Ab.: ABSENT
IgG P39 Ab.: ABSENT
IgG P41 Ab.: ABSENT
IgG P45 Ab.: ABSENT
IgG P66 Ab.: ABSENT
IgG P93 Ab.: ABSENT
IgM P23 Ab.: ABSENT
IgM P41 Ab.: ABSENT
Lyme IgG Wb: NEGATIVE
Lyme IgM Wb: NEGATIVE

## 2020-10-10 LAB — CBC
HCT: 40.7 % (ref 39.0–52.0)
Hemoglobin: 12.9 g/dL — ABNORMAL LOW (ref 13.0–17.0)
MCH: 23.8 pg — ABNORMAL LOW (ref 26.0–34.0)
MCHC: 31.7 g/dL (ref 30.0–36.0)
MCV: 75.1 fL — ABNORMAL LOW (ref 80.0–100.0)
Platelets: 302 10*3/uL (ref 150–400)
RBC: 5.42 MIL/uL (ref 4.22–5.81)
RDW: 14.2 % (ref 11.5–15.5)
WBC: 8.1 10*3/uL (ref 4.0–10.5)
nRBC: 0 % (ref 0.0–0.2)

## 2020-10-10 LAB — BASIC METABOLIC PANEL
Anion gap: 6 (ref 5–15)
BUN: 27 mg/dL — ABNORMAL HIGH (ref 6–20)
CO2: 25 mmol/L (ref 22–32)
Calcium: 9.4 mg/dL (ref 8.9–10.3)
Chloride: 99 mmol/L (ref 98–111)
Creatinine, Ser: 0.99 mg/dL (ref 0.61–1.24)
GFR, Estimated: >60 mL/min (ref >60)
Glucose, Bld: 92 mg/dL (ref 70–99)
Potassium: 4.5 mmol/L (ref 3.5–5.1)
Sodium: 130 mmol/L — ABNORMAL LOW (ref 135–145)

## 2020-10-10 LAB — B. BURGDORFI ANTIBODIES: B burgdorferi Ab IgG+IgM: 0.95 {ISR} — ABNORMAL HIGH (ref 0.00–0.90)

## 2020-10-10 LAB — CMV DNA, QUANTITATIVE, PCR
CMV DNA Quant: 553 IU/mL
Log10 CMV Qn DNA Pl: 2.743 log10 IU/mL

## 2020-10-10 LAB — MAGNESIUM: Magnesium: 2.1 mg/dL (ref 1.7–2.4)

## 2020-10-10 MED ORDER — CYANOCOBALAMIN 500 MCG PO TABS: 500.0000 ug | ORAL_TABLET | Freq: Every day | ORAL | 3 refills | Status: AC

## 2020-10-10 NOTE — Progress Notes (Signed)
Neurology Progress Note   S:// No new complaints.  Reports walking is somewhat better  O:// Current vital signs: BP (!) 129/92 (BP Location: Left Arm)   Pulse 96   Temp 98.6 F (37 C) (Oral)   Resp 18   Ht 5\' 10"  (1.778 m)   Wt 59 kg   SpO2 100%   BMI 18.65 kg/m  Vital signs in last 24 hours: Temp:  [97.5 F (36.4 C)-98.6 F (37 C)] 98.6 F (37 C) (04/22 0507) Pulse Rate:  [96-107] 96 (04/22 0900) Resp:  [16-20] 18 (04/22 0900) BP: (118-139)/(73-92) 129/92 (04/22 0900) SpO2:  [99 %-100 %] 100 % (04/22 0900) GENERAL: Awake, alert in NAD HEENT: - Normocephalic and atraumatic, dry mm, no LN++, no Thyromegally LUNGS - Clear to auscultation bilaterally with no wheezes CV - S1S2 RRR, no m/r/g, equal pulses bilaterally. ABDOMEN - Soft, nontender, nondistended with normoactive BS Ext: warm, well perfused, intact peripheral pulses, no edema  NEURO:  Mental Status: AA&Ox3  Language: speech is clear.  Naming, repetition, fluency, and comprehension intact. Cranial Nerves: PERRL. EOMI, visual fields full, no facial asymmetry, facial sensation intact, hearing intact, tongue/uvula/soft palate midline, normal sternocleidomastoid and trapezius muscle strength. No evidence of tongue atrophy or fibrillations Motor: Both upper extremities are nearly 5/5 without any drift.  Both lower extremities are at least 4+/5 with very subtle asymmetry with the left leg being slightly weaker than the right. Sensation-intact in the upper extremities.  Diminished to all components in a stocking type pattern in the lower extremities up to mid calf. Coordination: FTN intact bilaterally Gait-deferred DTRs: Absent biceps, triceps, brachioradialis bilaterally.  Absent knee jerks bilaterally.  Absent ankle jerks bilaterally.  Somewhat improved and lower extremity strength from the exam from 2 days ago.  Medications  Current Facility-Administered Medications:  .  0.9 %  sodium chloride infusion, 250 mL,  Intravenous, PRN, Agbata, Tochukwu, MD .  acetaminophen (TYLENOL) tablet 1,000 mg, 1,000 mg, Oral, TID PRN, 1,000 mg at 10/08/20 2155 **OR** [DISCONTINUED] acetaminophen (TYLENOL) suppository 650 mg, 650 mg, Rectal, Q6H PRN, Agbata, Tochukwu, MD .  cyanocobalamin ((VITAMIN B-12)) injection 1,000 mcg, 1,000 mcg, Intramuscular, Daily, 1,000 mcg at 10/09/20 0831 **FOLLOWED BY** [START ON 10/11/2020] cyanocobalamin ((VITAMIN B-12)) injection 1,000 mcg, 1,000 mcg, Intramuscular, Weekly, Elgergawy, 10/13/2020, MD .  cyclobenzaprine (FLEXERIL) tablet 10 mg, 10 mg, Oral, TID PRN, Leana Roe, MD, 10 mg at 10/09/20 2206 .  enoxaparin (LOVENOX) injection 40 mg, 40 mg, Subcutaneous, Q24H, Agbata, Tochukwu, MD, 40 mg at 10/09/20 2208 .  Immune Globulin 10% (PRIVIGEN) IV infusion 25 g, 400 mg/kg, Intravenous, Q24 Hr x 5, 2209, MD, Last Rate: 30 mL/hr at 10/06/20 1911, 25 g at 10/09/20 1642 .  ondansetron (ZOFRAN) tablet 4 mg, 4 mg, Oral, Q6H PRN **OR** ondansetron (ZOFRAN) injection 4 mg, 4 mg, Intravenous, Q6H PRN, Agbata, Tochukwu, MD, 4 mg at 10/08/20 0626 .  polyethylene glycol (MIRALAX / GLYCOLAX) packet 17 g, 17 g, Oral, Daily PRN, Elgergawy, 10/10/20, MD, 17 g at 10/09/20 10/11/20 .  polyethylene glycol (MIRALAX / GLYCOLAX) packet 17 g, 17 g, Oral, Once, Elgergawy, Dawood S, MD .  sodium chloride flush (NS) 0.9 % injection 3 mL, 3 mL, Intravenous, Q12H, Agbata, Tochukwu, MD, 3 mL at 10/09/20 2208 .  sodium chloride flush (NS) 0.9 % injection 3 mL, 3 mL, Intravenous, PRN, Agbata, Tochukwu, MD, 3 mL at 10/05/20 2028 .  valGANciclovir (VALCYTE) 450 MG tablet TABS 900 mg, 900 mg, Oral, BID, 2029  P, MD, 900 mg at 10/09/20 2206  Labs CBC    Component Value Date/Time   WBC 8.1 10/10/2020 0524   RBC 5.42 10/10/2020 0524   HGB 12.9 (L) 10/10/2020 0524   HCT 40.7 10/10/2020 0524   PLT 302 10/10/2020 0524   MCV 75.1 (L) 10/10/2020 0524   MCH 23.8 (L) 10/10/2020 0524   MCHC 31.7 10/10/2020 0524    RDW 14.2 10/10/2020 0524   LYMPHSABS 5.8 (H) 10/04/2020 0040   MONOABS 0.9 10/04/2020 0040   EOSABS 0.1 10/04/2020 0040   BASOSABS 0.1 10/04/2020 0040    CMP     Component Value Date/Time   NA 130 (L) 10/10/2020 0524   K 4.5 10/10/2020 0524   CL 99 10/10/2020 0524   CO2 25 10/10/2020 0524   GLUCOSE 92 10/10/2020 0524   BUN 27 (H) 10/10/2020 0524   CREATININE 0.99 10/10/2020 0524   CALCIUM 9.4 10/10/2020 0524   PROT 7.5 10/04/2020 0040   ALBUMIN 4.0 (L) 10/04/2020 0637   AST 76 (H) 10/04/2020 0040   ALT 70 (H) 10/04/2020 0040   ALKPHOS 54 10/04/2020 0040   BILITOT 0.7 10/04/2020 0040   GFRNONAA >60 10/10/2020 0524   A1c 6.0 B12 182 Vitamin D15.1 AST 76 ALT 70  CSF studies: WBC tube 4: 11 WBC tube 1: 19 Protein: 22 Glucose: 69 Gram Stain: No organisms seen CSF Cxs: pending. HIV Ab: neg RPR: neg. Hepatitis panel: Neg.  - Serum CMV IgG and CMV IgM- positive. CMV antibody IgG 1.90 with normal range 0.0-0.59 CMV antibody IgM 224.0.  Negative is less than 30, equivocal is 30-34.9 and positive is greater than 34.9.   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  Given location, probably sequela of remote trauma.  Negative MRI for demyelination.  Mild to moderate paranasal sinus inflammation since December-most pronounced in the left frontal sinus. Cervical and thoracic spine normal.  Assessment:  25 year old man, with no significant past medical history presents with 1 week of progressive neurological symptoms that started with pain in his left thigh, progressing to pain and weakness along with tingling and numbness in both his legs/feet along with tingling and numbness in his fingertips. He was areflexic with the exception of a  brachioradialis-my examination today reveals complete areflexia in both upper and lower extremities. Presentation is rather atypical for Guillain-Barr syndrome. Has significant B12 and vitamin D deficiency-could explain some of these findings. The perplexing part on his CSF analysis is the presence of lymphocytic predominant pleocytosis with normal glucose and protein- not typical for GBS or its variants. CMV IgG and IgM have returned positive.  These are serum studies.  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 Gentzler is HIV negative.  Has been evaluated by infectious diseases with recommendations for antibiotic treatment for CMV although efficacy unclear and immune competent patients.  Differentials continue to include atypical Guillain-Barr syndrome versus CMV polyradiculoneuropathy (perplexing CSF findings of lymphocytic predominant pleocytosis with normal protein levels and normal glucose)  Recommendations:  Complete 5 days of IVIG - last dose today.  Okay to discharge after last dose if also okay by primary team.  Outpatient follow-up with Dr. Nita Sickle at [facility] neurology-neuromuscular neurologist, for evaluation, outpatient management and possible EMG nerve  conduction studies.  Appreciate ID consultation-continue Anti-viral treatment of CMV as per ID recommendations for 10 days duration.  PT OT   Discussed with Dr. Randol Kern. Sent a referral message to outpatient neurology office of Dr. Allena Katz.   Please call with questions.  -- Milon Dikes, MD Neurologist [facility] Pager: [phone]

## 2020-10-10 NOTE — Discharge Instructions (Signed)
Follow with Primary MD  in 7 days   Get CBC, CMP,  checked  by Primary MD next visit.    Activity: As tolerated with Full fall precautions use walker/cane & assistance as needed   Disposition Home    Diet: Regular diet   On your next visit with your primary care physician please Get Medicines reviewed and adjusted.   Please request your Prim.MD to go over all Hospital Tests and Procedure/Radiological results at the follow up, please get all Hospital records sent to your Prim MD by signing hospital release before you go home.   If you experience worsening of your admission symptoms, develop shortness of breath, life threatening emergency, suicidal or homicidal thoughts you must seek medical attention immediately by calling 911 or calling your MD immediately  if symptoms less severe.  You Must read complete instructions/literature along with all the possible adverse reactions/side effects for all the Medicines you take and that have been prescribed to you. Take any new Medicines after you have completely understood and accpet all the possible adverse reactions/side effects.   Do not drive, operating heavy machinery, perform activities at heights, swimming or participation in water activities or provide baby sitting services if your were admitted for syncope or siezures until you have seen by Primary MD or a Neurologist and advised to do so again.  Do not drive when taking Pain medications.    Do not take more than prescribed Pain, Sleep and Anxiety Medications  Special Instructions: If you have smoked or chewed Tobacco  in the last 2 yrs please stop smoking, stop any regular Alcohol  and or any Recreational drug use.  Wear Seat belts while driving.   Please note  You were cared for by a hospitalist during your hospital stay. If you have any questions about your discharge medications or the care you received while you were in the hospital after you are discharged, you can call the  unit and asked to speak with the hospitalist on call if the hospitalist that took care of you is not available. Once you are discharged, your primary care physician will handle any further medical issues. Please note that NO REFILLS for any discharge medications will be authorized once you are discharged, as it is imperative that you return to your primary care physician (or establish a relationship with a primary care physician if you do not have one) for your aftercare needs so that they can reassess your need for medications and monitor your lab values.  

## 2020-10-10 NOTE — Progress Notes (Signed)
Went through discharge instruction with patient, verbalize understanding, patient left POV with his friend

## 2020-10-10 NOTE — Discharge Summary (Signed)
Physician Discharge Summary  Brian Summers WUJ:[id number] DOB: 01-12-1996 DOA: 10/03/2020  PCP: System, Provider Not In  Admit date: 10/03/2020 Discharge date: 10/10/2020  Admitted From: Home Disposition:  Home   Recommendations for Outpatient Follow-up:  1. To follow with neurology as an outpatient, ambulatory referral has been made to low-power neurology   Home Health:YES Equipment/Devices: rolling walker  Discharge Condition:Stable CODE STATUS:FULL Diet recommendation: Regular   Brief/Interim Summary:  Brian Summers a 24 y.o.malewithno significant pastmedical history who presents to the emergency room for evaluation of bilateral lower extremity weakness, numbness and tingling involving his lower extremities and progression to his upper arms, pain in the back of his neck as well as shoulder pain. Symptoms have been ongoing for the past 1 week and have been progressive. He also complains of pain in his left leg/thigh with radiation to his shin and now left foot with associated numbness making it difficult for him to ambulate. Patient states that he fell about 3 days prior to presenting in the emergency room due to having an unsteady gait.  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 - MRI is negative for demyelinating lesion -Neurology input greatly  appreciated, he was treated with 5 days of IVIG. 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  Dilatory referral has been made to let our neurology Dr. 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   vitamin B-12 500 MCG tablet Commonly known as: CYANOCOBALAMIN Take 1 tablet (500 mcg total) by mouth daily.            Durable Medical Equipment  (From admission, onward)         Start     Ordered   10/09/20 0935  For home use only DME 3 n 1  Once  10/09/20 0934   10/09/20 0935  For home use only DME Walker rolling  Once       Question Answer Comment  Walker: With 5 Inch Wheels   Patient needs a walker to treat with the following condition Weakness generalized      10/09/20  0934          No Known Allergies  Consultations:  Neurology  ID   Procedures/Studies: MR Brain W and Wo Contrast  Result Date: 10/04/2020 CLINICAL DATA:  25 year old male with 1 week of bilateral leg weakness and numbness, symptom progression to upper extremities. EXAM: MRI HEAD WITHOUT AND WITH CONTRAST TECHNIQUE: Multiplanar, multiecho pulse sequences of the brain and surrounding structures were obtained without and with intravenous contrast. CONTRAST:  7.12mL GADAVIST GADOBUTROL 1 MMOL/ML IV SOLN COMPARISON:  Head CT 05/28/2020. FINDINGS: Mild susceptibility artifact along the anterior vertex of unclear etiology. Brain: Focal cortical encephalomalacia (series 17, image 109) or less likely congenital sulcal variant along the anterior left superior frontal gyrus. T2 appearance also favors chronic encephalomalacia (series 18, image 23), and this is stable from the 06/30/23 CT. No superimposed restricted diffusion to suggest acute infarction. No midline shift, mass effect, evidence of mass lesion, ventriculomegaly, extra-axial collection or acute intracranial hemorrhage. Cervicomedullary junction and pituitary are within normal limits. Wallace Cullens and white matter signal appears normal outside of the anterior left superior frontal gyrus. No chronic cerebral blood products are evident. No cerebral white matter lesions. Deep gray matter nuclei, brainstem and cerebellum appear normal. No abnormal enhancement identified. No dural thickening. There is a small developmental venous anomaly in proximity to the abnormal anterior left frontal lobe (series 19, image 105), normal variant. Vascular: Major intracranial vascular flow voids are preserved. The major dural venous sinuses are enhancing and appear to be patent. Skull and upper cervical spine: Normal visible cervical spine. Visualized bone marrow signal is within normal limits. Sinuses/Orbits: Hyperplastic paranasal sinuses. Heterogeneous T1/FLAIR/T2 material  now in the lateral portion of the left frontal sinus compatible with inflammatory sinus disease. Mild associated mucosal thickening at the left frontoethmoidal recess. Mild mucosal thickening also in the alveolar recess of the right maxillary sinus now. The other sinuses remain clear. Orbits appear negative. Other: Mastoids are clear. Visible internal auditory structures appear normal. IMPRESSION: 1. No acute intracranial abnormality. Small area of chronic cortical encephalomalacia in the anterior left superior frontal gyrus, nonspecific and stable since a Jun 30, 2023 CT. Given the location perhaps this is sequelae of remote trauma. Negative MRI appearance of the brain otherwise. No evidence of demyelination. 2. Mild to moderate new paranasal sinus inflammation since Jun 30, 2023, most pronounced in the left frontal sinus. Electronically Signed   By: Odessa Fleming M.D.   On: 10/04/2020 06:07   MR Cervical Spine W or Wo Contrast  Result Date: 10/04/2020 CLINICAL DATA:  25 year old male with 1 week of bilateral leg weakness and numbness, symptom progression to upper extremities. EXAM: MRI CERVICAL SPINE WITHOUT AND WITH CONTRAST TECHNIQUE: Multiplanar and multiecho pulse sequences of the cervical spine, to include the craniocervical junction and cervicothoracic junction, were obtained without and with intravenous contrast. CONTRAST:  7.72mL GADAVIST GADOBUTROL 1 MMOL/ML IV SOLN in conjunction with contrast enhanced imaging of the brain reported separately. COMPARISON:  Brain MRI the same day reported separately. FINDINGS: Alignment: Straightening of cervical lordosis. Subtle dextroconvex cervical scoliosis. No spondylolisthesis. Vertebrae: No marrow edema or evidence of acute osseous abnormality. Visualized bone marrow signal is within normal limits. Cord: Normal. No spinal cord signal abnormality or  volume loss identified. No cord expansion. No abnormal intradural enhancement. No dural thickening. Posterior Fossa, vertebral  arteries, paraspinal tissues: Cervicomedullary junction is within normal limits. Brain reported separately today. Preserved major vascular flow voids in the neck. The left vertebral artery appears somewhat dominant. Negative visible neck soft tissues, lung apices. Disc levels: Capacious spinal canal. No significant cervical spine degeneration. No spinal stenosis or convincing neural impingement. IMPRESSION: Negative MRI appearance of the cervical spine aside from mild dextroconvex scoliosis. Normal cervical spinal cord.  No significant degenerative changes. Electronically Signed   By: Odessa Fleming M.D.   On: 10/04/2020 06:11   MR THORACIC SPINE W WO CONTRAST  Result Date: 10/04/2020 CLINICAL DATA:  25 year old male with 1 week of bilateral leg weakness and numbness, symptom progression to upper extremities. EXAM: MRI THORACIC WITHOUT AND WITH CONTRAST TECHNIQUE: Multiplanar and multiecho pulse sequences of the thoracic spine were obtained without and with intravenous contrast. CONTRAST:  7.81mL GADAVIST GADOBUTROL 1 MMOL/ML IV SOLN in conjunction with contrast enhanced imaging of the brain and cervical spine reported separately. COMPARISON:  Brain and cervical spine today reported separately. FINDINGS: Limited cervical spine imaging:  Detailed separately today. Thoracic spine segmentation:  Appears normal. Alignment: Normal thoracic kyphosis. Minimal levoconvex upper thoracic scoliosis. No spondylolisthesis. Vertebrae: No marrow edema or evidence of acute osseous abnormality. Visualized bone marrow signal is within normal limits. Cord: Normal thoracic spinal cord. No cord signal abnormality or volume loss identified. No abnormal intradural enhancement or dural thickening. Capacious thoracic spinal canal throughout. The conus medullaris appears normal at T12-L1. Paraspinal and other soft tissues: Negative. Disc levels: Negative. No thoracic spine degeneration or neural impingement identified. IMPRESSION: Normal  Thoracic Spine MRI. Electronically Signed   By: Odessa Fleming M.D.   On: 10/04/2020 06:16   ECHOCARDIOGRAM COMPLETE  Result Date: 10/09/2020    ECHOCARDIOGRAM REPORT   Patient Name:   Brian Summers Date of Exam: 10/09/2020 Medical Rec #:  [id number]    Height:       70.0 in Accession #:    [id number]   Weight:       130.0 lb Date of Birth:  02-13-1996   BSA:          1.738 m Patient Age:    24 years     BP:           118/91 mmHg Patient Gender: M            HR:           107 bpm. Exam Location:  [facility] Procedure: 2D Echo, Cardiac Doppler and Color Doppler Indications:     Sinus tachycardia  History:         Patient has no prior history of Echocardiogram examinations. No                  medical history on file.  Sonographer:     Cristela Blue RDCS (AE) Referring Phys:  [id number] Starleen Arms Diagnosing Phys: Julien Nordmann MD IMPRESSIONS  1. Left ventricular ejection fraction, by estimation, is 50 to 55%. The left ventricle has low normal function. The left ventricle has no regional wall motion abnormalities. Left ventricular diastolic parameters were normal.  2. Right ventricular systolic function is normal. The right ventricular size is normal.  3. The aortic valve was not well visualized, unable to exclude bicuspid aortic valve. Aortic valve regurgitation is not visualized. No aortic stenosis is present. FINDINGS  Left Ventricle: Left ventricular ejection fraction, by estimation,  is 50 to 55%. The left ventricle has low normal function. The left ventricle has no regional wall motion abnormalities. The left ventricular internal cavity size was normal in size. There is no left ventricular hypertrophy. Left ventricular diastolic parameters were normal. Right Ventricle: The right ventricular size is normal. No increase in right ventricular wall thickness. Right ventricular systolic function is normal. Left Atrium: Left atrial size was normal in size. Right Atrium: Right atrial size was normal in size. Pericardium: There is  no evidence of pericardial effusion. Mitral Valve: The mitral valve is normal in structure. No evidence of mitral valve regurgitation. No evidence of mitral valve stenosis. Tricuspid Valve: The tricuspid valve is normal in structure. Tricuspid valve regurgitation is not demonstrated. No evidence of tricuspid stenosis. Aortic Valve: The aortic valve was not well visualized. Aortic valve regurgitation is not visualized. No aortic stenosis is present. Aortic valve mean gradient measures 2.0 mmHg. Aortic valve peak gradient measures 3.8 mmHg. Aortic valve area, by VTI measures 2.43 cm. Pulmonic Valve: The pulmonic valve was normal in structure. Pulmonic valve regurgitation is not visualized. No evidence of pulmonic stenosis. Aorta: The aortic root is normal in size and structure. Venous: The inferior vena cava is normal in size with greater than 50% respiratory variability, suggesting right atrial pressure of 3 mmHg. IAS/Shunts: No atrial level shunt detected by color flow Doppler.  LEFT VENTRICLE PLAX 2D LVIDd:         3.85 cm  Diastology LVIDs:         2.52 cm  LV e' medial:    9.68 cm/s LV PW:         1.17 cm  LV E/e' medial:  7.2 LV IVS:        0.88 cm  LV e' lateral:   12.00 cm/s LVOT diam:     2.10 cm  LV E/e' lateral: 5.8 LV SV:         34 LV SV Index:   19 LVOT Area:     3.46 cm  RIGHT VENTRICLE RV Basal diam:  2.73 cm RV S prime:     15.20 cm/s TAPSE (M-mode): 3.4 cm LEFT ATRIUM           Index       RIGHT ATRIUM          Index LA diam:      1.80 cm 1.04 cm/m  RA Area:     9.37 cm LA Vol (A2C): 35.3 ml 20.31 ml/m RA Volume:   19.30 ml 11.10 ml/m LA Vol (A4C): 12.7 ml 7.31 ml/m  AORTIC VALVE                   PULMONIC VALVE AV Area (Vmax):    2.82 cm    PV Vmax:        0.98 m/s AV Area (Vmean):   2.76 cm    PV Peak grad:   3.9 mmHg AV Area (VTI):     2.43 cm    RVOT Peak grad: 5 mmHg AV Vmax:           97.60 cm/s AV Vmean:          69.100 cm/s AV VTI:            0.138 m AV Peak Grad:      3.8 mmHg AV  Mean Grad:      2.0 mmHg LVOT Vmax:         79.60 cm/s LVOT Vmean:  55.000 cm/s LVOT VTI:          0.097 m LVOT/AV VTI ratio: 0.70  AORTA Ao Root diam: 3.15 cm MITRAL VALVE               TRICUSPID VALVE MV Area (PHT): 4.57 cm    TR Peak grad:   16.8 mmHg MV Decel Time: 166 msec    TR Vmax:        205.00 cm/s MV E velocity: 69.80 cm/s MV A velocity: 55.70 cm/s  SHUNTS MV E/A ratio:  1.25        Systemic VTI:  0.10 m                            Systemic Diam: 2.10 cm Julien Nordmann MD Electronically signed by Julien Nordmann MD Signature Date/Time: 10/09/2020/4:59:21 PM    Final        Subjective:  Report tingling and numbness in extremities, but report weakness and gait has improved. Discharge Exam: Vitals:   10/10/20 0900 10/10/20 1158  BP: (!) 129/92 133/90  Pulse: 96 (!) 105  Resp: 18 20  Temp:  98.5 F (36.9 C)  SpO2: 100% 100%   Vitals:   10/09/20 1950 10/10/20 0507 10/10/20 0900 10/10/20 1158  BP: 126/77 130/87 (!) 129/92 133/90  Pulse: 97 (!) 102 96 (!) 105  Resp: 17 16 18 20   Temp: 98.3 F (36.8 C) 98.6 F (37 C)  98.5 F (36.9 C)  TempSrc: Oral Oral  Oral  SpO2: 100% 100% 100% 100%  Weight:      Height:        General: Pt is alert, awake, not in acute distress Cardiovascular: RRR, S1/S2 +, no rubs, no gallops Respiratory: CTA bilaterally, no wheezing, no rhonchi Abdominal: Soft, NT, ND, bowel sounds + Extremities: no edema, no cyanosis, lateral lower extremity weakness left> right, 4/5    The results of significant diagnostics from this hospitalization (including imaging, microbiology, ancillary and laboratory) are listed below for reference.     Microbiology: Recent Results (from the past 240 hour(s))  Resp Panel by RT-PCR (Flu A&B, Covid) Nasopharyngeal Swab     Status: None   Collection Time: 10/04/20 12:40 AM   Specimen: Nasopharyngeal Swab; Nasopharyngeal(NP) swabs in vial transport medium  Result Value Ref Range Status   SARS Coronavirus 2 by RT  PCR NEGATIVE NEGATIVE Final    Comment: (NOTE) SARS-CoV-2 target nucleic acids are NOT DETECTED.  The SARS-CoV-2 RNA is generally detectable in upper respiratory specimens during the acute phase of infection. The lowest concentration of SARS-CoV-2 viral copies this assay can detect is 138 copies/mL. A negative result does not preclude SARS-Cov-2 infection and should not be used as the sole basis for treatment or other patient management decisions. A negative result may occur with  improper specimen collection/handling, submission of specimen other than nasopharyngeal swab, presence of viral mutation(s) within the areas targeted by this assay, and inadequate number of viral copies(<138 copies/mL). A negative result must be combined with clinical observations, patient history, and epidemiological information. The expected result is Negative.  Fact Sheet for Patients:  10/06/20  Fact Sheet for Healthcare Providers:  BloggerCourse.com  This test is no t yet approved or cleared by the [place] FDA and  has been authorized for detection and/or diagnosis of SARS-CoV-2 by FDA under an Emergency Use Authorization (EUA). This EUA will remain  in effect (meaning this test can be used) for  the duration of the COVID-19 declaration under Section 564(b)(1) of the Act, 21 U.S.C.section 360bbb-3(b)(1), unless the authorization is terminated  or revoked sooner.       Influenza A by PCR NEGATIVE NEGATIVE Final   Influenza B by PCR NEGATIVE NEGATIVE Final    Comment: (NOTE) The Xpert Xpress SARS-CoV-2/FLU/RSV plus assay is intended as an aid in the diagnosis of influenza from Nasopharyngeal swab specimens and should not be used as a sole basis for treatment. Nasal washings and aspirates are unacceptable for Xpert Xpress SARS-CoV-2/FLU/RSV testing.  Fact Sheet for Patients: BloggerCourse.com  Fact Sheet for  Healthcare Providers: SeriousBroker.it  This test is not yet approved or cleared by the [place] FDA and has been authorized for detection and/or diagnosis of SARS-CoV-2 by FDA under an Emergency Use Authorization (EUA). This EUA will remain in effect (meaning this test can be used) for the duration of the COVID-19 declaration under Section 564(b)(1) of the Act, 21 U.S.C. section 360bbb-3(b)(1), unless the authorization is terminated or revoked.  Performed at [facility], [address] [place], [place] [address]   CSF culture w Gram Stain     Status: None   Collection Time: 10/04/20  6:37 AM   Specimen: Lumbar Puncture; Cerebrospinal Fluid  Result Value Ref Range Status   Specimen Description   Final    LUMBAR Performed at [facility], [address], [place], [place] [address]    Special Requests   Final    NONE Performed at [facility], [address] [place], [place] [address]    Gram Stain   Final    NO ORGANISMS SEEN WBC SEEN RBC SEEN Performed at [facility], [address] [place], [place] [address]    Culture   Final    NO GROWTH 3 DAYS Performed at [facility], [address] [place], [place] [address]    Report Status 10/08/2020 FINAL  Final     Labs: BNP (last 3 results) No results for input(s): BNP in the last 8760 hours. Basic Metabolic Panel: Recent Labs  Lab 10/06/20 0351 10/07/20 0501 10/08/20 0349 10/09/20 0342 10/10/20 0524  NA 136 136 134* 131* 130*  K 3.8 4.4 4.5 4.9 4.5  CL 102 101 99 97* 99  CO2 GLUCOSE 109* 109* 111* 105* 92  BUN 27*  CREATININE 0.97 1.03 0.99 1.02 0.99  CALCIUM 9.2 9.5 9.6 9.9 9.4  MG 2.1 2.1 2.3 2.2 2.1   Liver Function Tests: Recent Labs  Lab 10/04/20 0040 10/04/20 0637  AST 76*  --   ALT 70*  --   ALKPHOS 54  --   BILITOT 0.7  --   PROT 7.5  --   ALBUMIN 3.9 4.0*   No results for input(s):  LIPASE, AMYLASE in the last 168 hours. No results for input(s): AMMONIA in the last 168 hours. CBC: Recent Labs  Lab 10/04/20 0040 10/05/20 0353 10/06/20 0351 10/07/20 0501 10/08/20 0349 10/09/20 0342 10/10/20 0524  WBC 8.6   < > 7.2 7.7 8.1 9.6 8.1  NEUTROABS 1.7  --   --   --   --   --   --   HGB 12.5*   < > 13.1 14.0 14.4 14.0 12.9*  HCT 40.0   < > 41.3 43.7 45.2 44.1 40.7  MCV 75.6*   < > 74.7* 73.9* 74.3* 75.5* 75.1*  PLT 246   < >  262 279 305 327 302   < > = values in this interval not displayed.   Cardiac Enzymes: Recent Labs  Lab 10/04/20 0040  CKTOTAL 486*   BNP: Invalid input(s): POCBNP CBG: Recent Labs  Lab 10/04/20 0058  GLUCAP 130*   D-Dimer No results for input(s): DDIMER in the last 72 hours. Hgb A1c No results for input(s): HGBA1C in the last 72 hours. Lipid Profile No results for input(s): CHOL, HDL, LDLCALC, TRIG, CHOLHDL, LDLDIRECT in the last 72 hours. Thyroid function studies Recent Labs    10/08/20 0349  TSH 1.453   Anemia work up No results for input(s): VITAMINB12, FOLATE, FERRITIN, TIBC, IRON, RETICCTPCT in the last 72 hours. Urinalysis    Component Value Date/Time   COLORURINE YELLOW (A) 10/04/2020 0157   APPEARANCEUR CLEAR (A) 10/04/2020 0157   LABSPEC 1.015 10/04/2020 0157   PHURINE 6.0 10/04/2020 0157   GLUCOSEU NEGATIVE 10/04/2020 0157   HGBUR NEGATIVE 10/04/2020 0157   BILIRUBINUR NEGATIVE 10/04/2020 0157   KETONESUR NEGATIVE 10/04/2020 0157   PROTEINUR NEGATIVE 10/04/2020 0157   NITRITE NEGATIVE 10/04/2020 0157   LEUKOCYTESUR NEGATIVE 10/04/2020 0157   Sepsis Labs Invalid input(s): PROCALCITONIN,  WBC,  LACTICIDVEN Microbiology Recent Results (from the past 240 hour(s))  Resp Panel by RT-PCR (Flu A&B, Covid) Nasopharyngeal Swab     Status: None   Collection Time: 10/04/20 12:40 AM   Specimen: Nasopharyngeal Swab; Nasopharyngeal(NP) swabs in vial transport medium  Result Value Ref Range Status   SARS Coronavirus 2  by RT PCR NEGATIVE NEGATIVE Final    Comment: (NOTE) SARS-CoV-2 target nucleic acids are NOT DETECTED.  The SARS-CoV-2 RNA is generally detectable in upper respiratory specimens during the acute phase of infection. The lowest concentration of SARS-CoV-2 viral copies this assay can detect is 138 copies/mL. A negative result does not preclude SARS-Cov-2 infection and should not be used as the sole basis for treatment or other patient management decisions. A negative result may occur with  improper specimen collection/handling, submission of specimen other than nasopharyngeal swab, presence of viral mutation(s) within the areas targeted by this assay, and inadequate number of viral copies(<138 copies/mL). A negative result must be combined with clinical observations, patient history, and epidemiological information. The expected result is Negative.  Fact Sheet for Patients:  BloggerCourse.comhttps://www.fda.gov/media/152166/download  Fact Sheet for Healthcare Providers:  SeriousBroker.ithttps://www.fda.gov/media/152162/download  This test is no t yet approved or cleared by the [place] FDA and  has been authorized for detection and/or diagnosis of SARS-CoV-2 by FDA under an Emergency Use Authorization (EUA). This EUA will remain  in effect (meaning this test can be used) for the duration of the COVID-19 declaration under Section 564(b)(1) of the Act, 21 U.S.C.section 360bbb-3(b)(1), unless the authorization is terminated  or revoked sooner.       Influenza A by PCR NEGATIVE NEGATIVE Final   Influenza B by PCR NEGATIVE NEGATIVE Final    Comment: (NOTE) The Xpert Xpress SARS-CoV-2/FLU/RSV plus assay is intended as an aid in the diagnosis of influenza from Nasopharyngeal swab specimens and should not be used as a sole basis for treatment. Nasal washings and aspirates are unacceptable for Xpert Xpress SARS-CoV-2/FLU/RSV testing.  Fact Sheet for Patients: BloggerCourse.comhttps://www.fda.gov/media/152166/download  Fact  Sheet for Healthcare Providers: SeriousBroker.ithttps://www.fda.gov/media/152162/download  This test is not yet approved or cleared by the [place] FDA and has been authorized for detection and/or diagnosis of SARS-CoV-2 by FDA under an Emergency Use Authorization (EUA). This EUA will remain in effect (meaning this test can be  used) for the duration of the COVID-19 declaration under Section 564(b)(1) of the Act, 21 U.S.C. section 360bbb-3(b)(1), unless the authorization is terminated or revoked.  Performed at [facility], [address] [place], [place] [address]   CSF culture w Gram Stain     Status: None   Collection Time: 10/04/20  6:37 AM   Specimen: Lumbar Puncture; Cerebrospinal Fluid  Result Value Ref Range Status   Specimen Description   Final    LUMBAR Performed at [facility], [address], [place], [place] [address]    Special Requests   Final    NONE Performed at [facility], [address] [place], [place] [address]    Gram Stain   Final    NO ORGANISMS SEEN WBC SEEN RBC SEEN Performed at [facility], [address], [place], [place] [address]    Culture   Final    NO GROWTH 3 DAYS Performed at [facility], [address] [place], [place] [address]    Report Status 10/08/2020 FINAL  Final     Time coordinating discharge: Over 30 minutes  SIGNED:   Huey Bienenstock, MD  [facility] 10/10/2020, 12:37 PM Pager   If 7PM-7AM, please contact night-coverage [email or web address] Password [email or web address]

## 2020-10-10 NOTE — TOC Transition Note (Signed)
Transition of Care [facility]) - CM/SW Discharge Note   Patient Details  Name: Brian Summers MRN: [id number] Date of Birth: 12/10/1995  Transition of Care [facility]) CM/SW Contact:  Allayne Butcher, RN Phone Number: 10/10/2020, 12:10 PM   Clinical Narrative:    Patient is medically cleared for discharge home today.  RNCM has given [facility] referral to [place] with [facility], she will be reaching out to the patient today.  Application for [facility] and MM given to patient yesterday and referral sent.     Final next level of care: Home w Home Health Services Barriers to Discharge: Barriers Resolved   Patient Goals and CMS Choice Patient states their goals for this hospitalization and ongoing recovery are:: to get stronger and get home CMS Medicare.gov Compare Post Acute Care list provided to:: Patient Choice offered to / list presented to : Patient  Discharge Placement                       Discharge Plan and Services   Discharge Planning Services: CM Consult,Medication Assistance,Indigent Health Clinic Post Acute Care Choice: Home Health          DME Arranged: 3-N-1,Walker rolling DME Agency: [employer] Date DME Agency Contacted: 10/09/20 Time DME Agency Contacted: 1000 Representative spoke with at DME Agency: Bjorn Loser HH Arranged: PT,OT HH Agency: [facility] Date HH Agency Contacted: 10/10/20 Time HH Agency Contacted: [id number] Representative spoke with at [facility] Agency: [place]  Social Determinants of Health (SDOH) Interventions     Readmission Risk Interventions No flowsheet data found.

## 2020-10-11 LAB — HSV 1/2 PCR, CSF
HSV-1 DNA: NEGATIVE
HSV-2 DNA: NEGATIVE

## 2020-10-12 LAB — LYME DISEASE DNA BY PCR(BORRELIA BURG): Lyme Disease(B.burgdorferi)PCR: NEGATIVE

## 2020-10-12 LAB — CRYOGLOBULIN

## 2020-10-13 LAB — PORPHYRINS, FRACTIONATION-PLASMA
Coproporphyrin.: <1 ug/dL (ref <1.0)
Heptacarboxyl Porphyrins: <1 ug/dL (ref <1.0)
Hexacarboxyl Porphyrins: <1.5 ug/dL (ref <1.5)
Pentacarboxyl Porphyrins: <1 ug/dL (ref <1.0)
Protoporphyrin: <1.5 ug/dL (ref <1.5)
Uroporphyrin: <1.5 ug/dL (ref <1.5)

## 2020-10-14 LAB — MS PROFILE+MBP, CSF + BLOOD
Albumin CSF-mCnc: 14 mg/dL (ref 10–45)
Albumin: 4.4 g/dL (ref 4.1–5.2)
CSF IgG Index: 0.6 (ref 0.0–0.7)
CSF/Serum Alb. Index: 3 (ref 0–8)
IgG (Immunoglobin G), Serum: 1723 mg/dL — ABNORMAL HIGH (ref 603–1613)
IgG Synthetic Rate: 2 mg/d
IgG, CSF: 3.4 mg/dL (ref 0.0–10.3)
IgG/Alb Ratio, CSF: 0.24 (ref 0.00–0.25)
Myelin Basic Protein: 1.9 ng/mL (ref 0.0–3.8)

## 2020-10-16 ENCOUNTER — Telehealth: Payer: Self-pay | Admitting: *Deleted

## 2020-10-16 NOTE — Telephone Encounter (Signed)
Tried calling pt to see if they are interested in becoming a pt here. LVM.   MD 10/16/20 @ 2:35 pm

## 2020-10-17 LAB — VITAMIN B1: Vitamin B1 (Thiamine): 49.3 nmol/L — ABNORMAL LOW (ref 66.5–200.0)

## 2020-10-28 ENCOUNTER — Telehealth: Payer: Self-pay | Admitting: Pharmacy Technician

## 2020-10-28 NOTE — Telephone Encounter (Signed)
Attempted to call patient.  Unable to reach.  Received a message indicating mailbox was full.  Mailing patient a new patient packet to obtain [facility] services.  [facility] must receive requested financial documentation within 30 days in order to determine eligibility and provide additional medication assistance.  Sherilyn Dacosta Care Manager [facility]

## 2021-03-31 ENCOUNTER — Telehealth: Payer: Self-pay

## 2021-03-31 NOTE — Telephone Encounter (Signed)
Facetime call with pt today. 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 Pt meets criteria to be released from isolation for monkeypox. No symptoms,lesion-healed. No visible rash/lesions to face,hands or arms. Letter sent to pt.

## 2022-09-14 IMAGING — CT CT HEAD W/O CM
3 series · 16 of 47 positions shown, 19 images · non-contrast
Comparison: None.

CLINICAL DATA: Seizure

EXAM:
CT HEAD WITHOUT CONTRAST
TECHNIQUE: Contiguous axial images were obtained from the base of the skull
through the vertex without intravenous contrast.

[Series 2: head wo · axial · 0.40mm/px · z∈[-134,-9]mm · 10 of 31 slices shown, 13 images]
[im 3/31  brain]
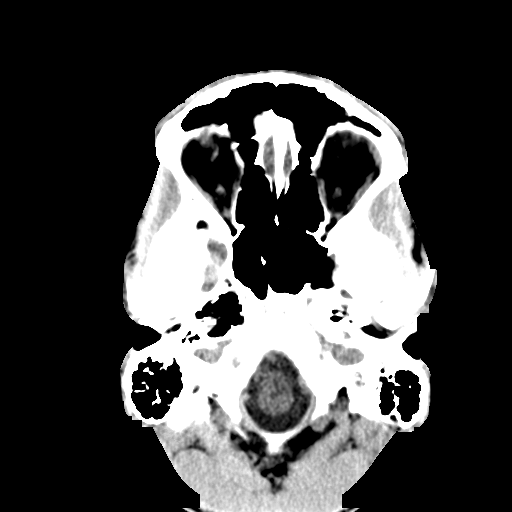
[im 3/31  bone]
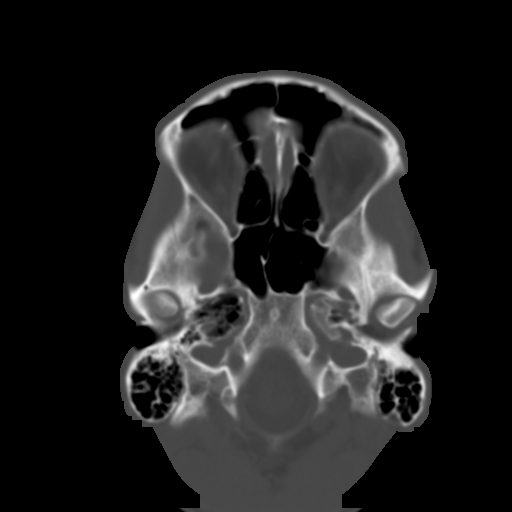
[im 6/31  brain]
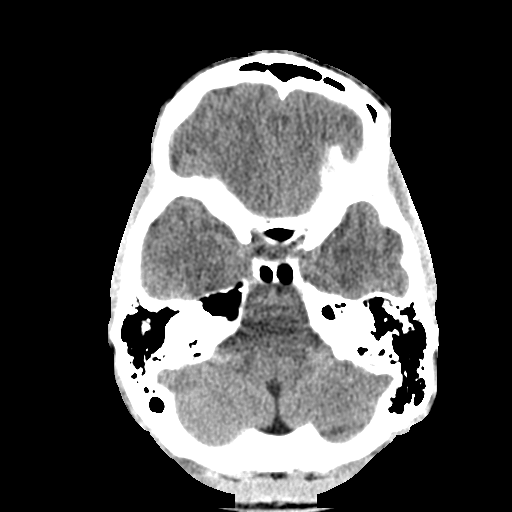
[im 9/31  brain]
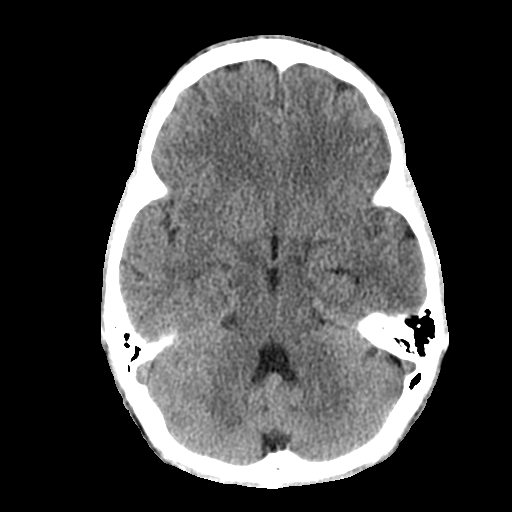
[im 11/31  brain]
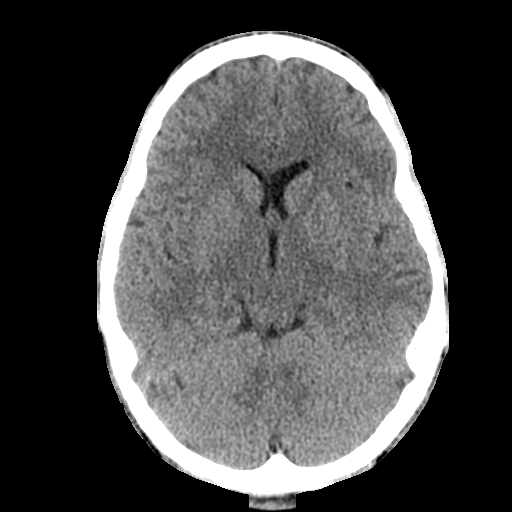
[im 14/31  brain]
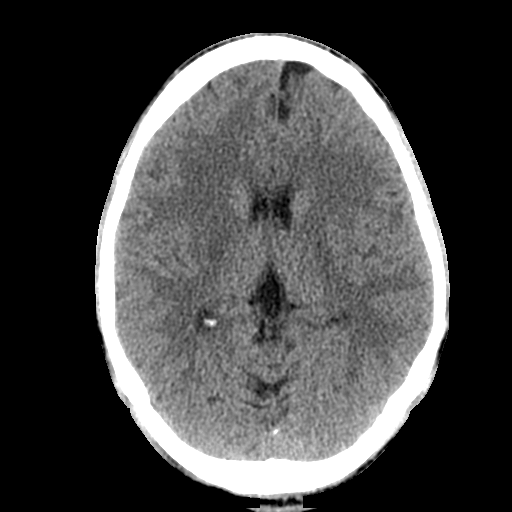
[im 14/31  bone]
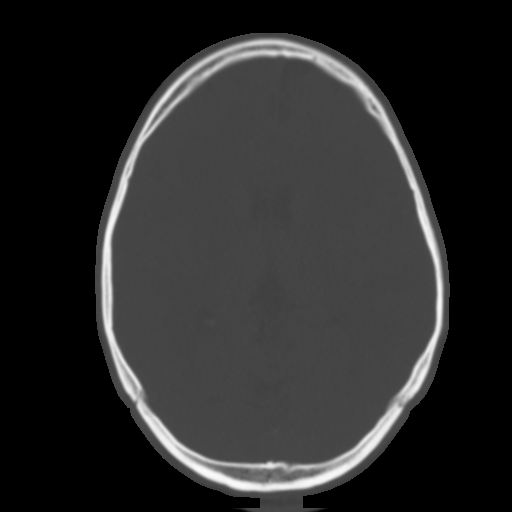
[im 17/31  brain]
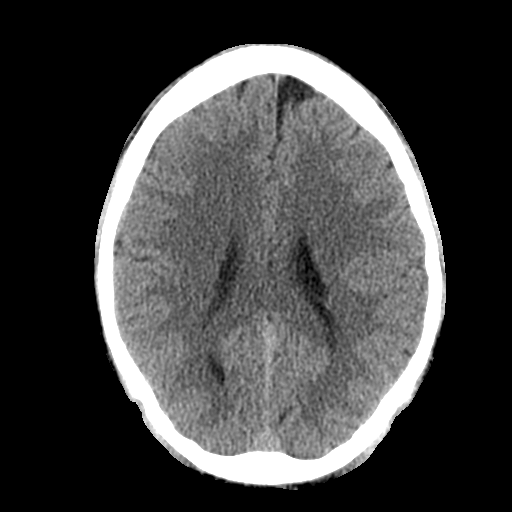
[im 20/31  brain]
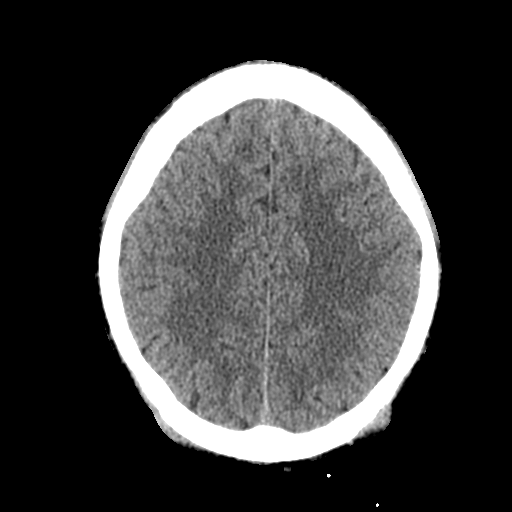
[im 23/31  brain]
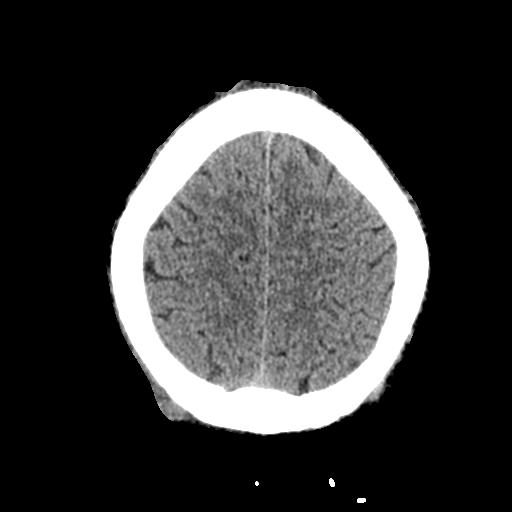
[im 25/31  brain]
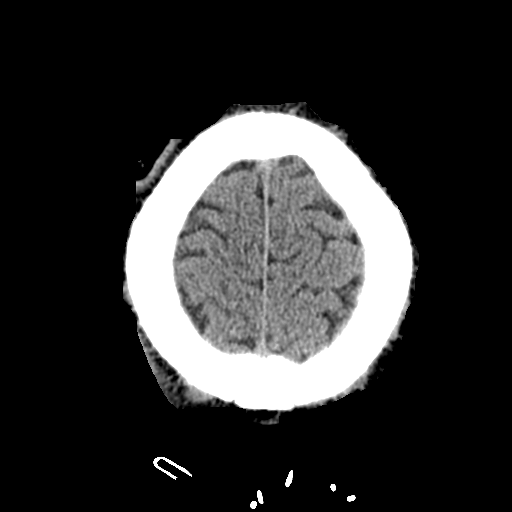
[im 25/31  bone]
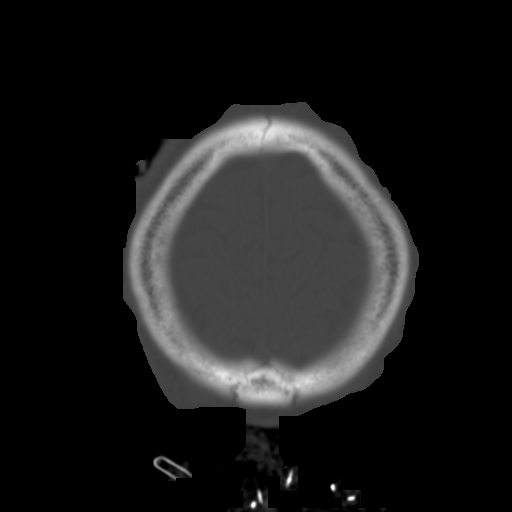
[im 28/31  brain]
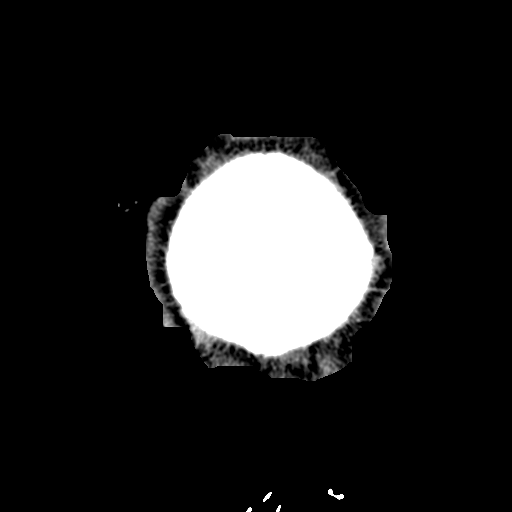

[Series 4: coronal soft tissue · coronal · 0.33mm/px · 3 of 73 slices shown]
[im 28/73  brain]
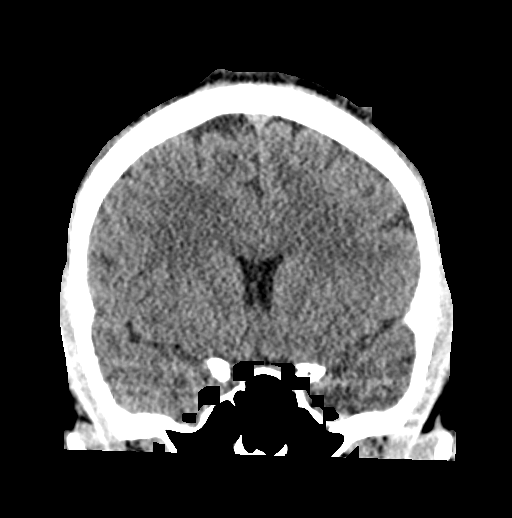
[im 34/73  brain]
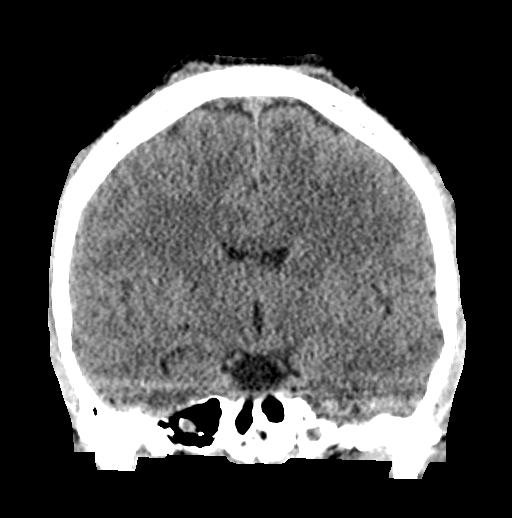
[im 39/73  brain]
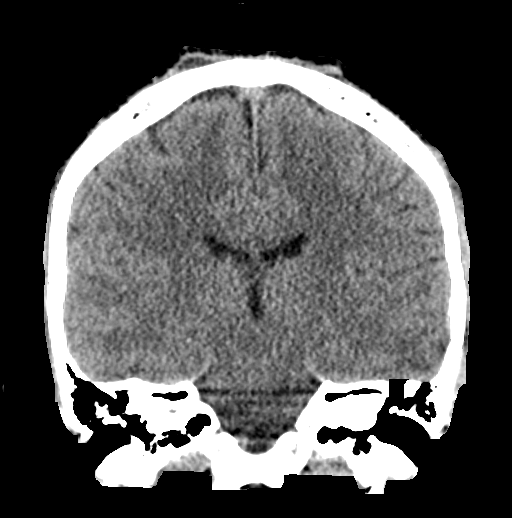

[Series 5: sagittal soft tissue · sagittal · 0.34mm/px · 3 of 55 slices shown]
[im 19/55  brain]
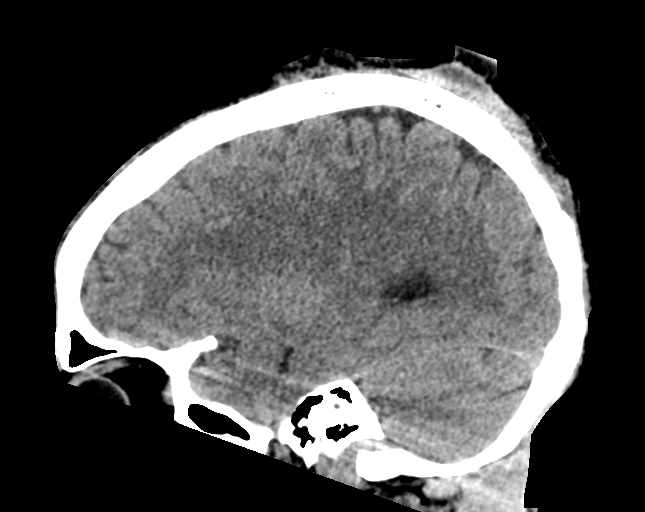
[im 28/55  brain]
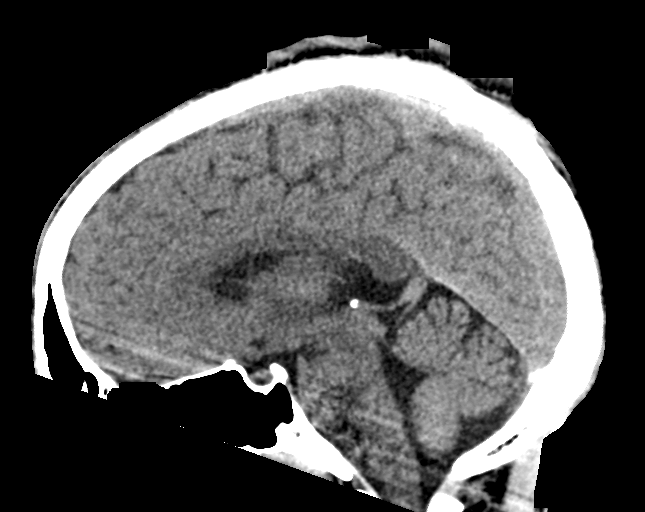
[im 37/55  brain]
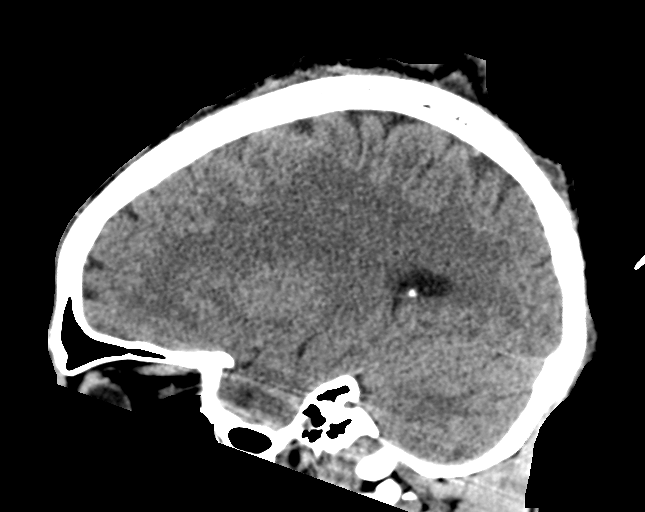

[16 of 47 positions shown; findings below may reference images not displayed]

FINDINGS: Brain: No evidence of acute territorial infarction, hemorrhage,
hydrocephalus,extra-axial collection or mass lesion/mass effect.
Normal gray-white differentiation. Ventricles are normal in size and
contour.

Vascular: No hyperdense vessel or unexpected calcification.

Skull: The skull is intact. No fracture or focal lesion identified.

Sinuses/Orbits: The visualized paranasal sinuses and mastoid air
cells are clear. The orbits and globes intact.

Other: None
IMPRESSION: No acute intracranial abnormality.

## 2023-07-01 ENCOUNTER — Other Ambulatory Visit (HOSPITAL_BASED_OUTPATIENT_CLINIC_OR_DEPARTMENT_OTHER): Payer: Self-pay
